# Patient Record
Sex: Male | Born: 1965 | Race: Black or African American | Hispanic: No | Marital: Married | Smoking: Never smoker
Health system: Southern US, Community
[De-identification: ages and names within clinical notes are randomized; demographics above are authoritative.]

## PROBLEM LIST (undated history)

## (undated) ENCOUNTER — Emergency Department (HOSPITAL_BASED_OUTPATIENT_CLINIC_OR_DEPARTMENT_OTHER): Admission: EM | Payer: Medicaid Other | Source: Home / Self Care

## (undated) DIAGNOSIS — Q602 Renal agenesis, unspecified: Secondary | ICD-10-CM

## (undated) DIAGNOSIS — K5731 Diverticulosis of large intestine without perforation or abscess with bleeding: Secondary | ICD-10-CM

## (undated) HISTORY — PX: OTHER SURGICAL HISTORY: SHX169

## (undated) HISTORY — PX: EYE SURGERY: SHX253

## (undated) HISTORY — PX: HAND SURGERY: SHX662

## (undated) HISTORY — PX: URETHRA SURGERY: SHX824

## (undated) HISTORY — PX: BACK SURGERY: SHX140

---

## 1997-06-21 ENCOUNTER — Emergency Department (HOSPITAL_COMMUNITY): Admission: EM | Admit: 1997-06-21 | Discharge: 1997-06-21 | Payer: Self-pay | Admitting: Emergency Medicine

## 1997-06-22 ENCOUNTER — Emergency Department (HOSPITAL_COMMUNITY): Admission: EM | Admit: 1997-06-22 | Discharge: 1997-06-22 | Payer: Self-pay | Admitting: Emergency Medicine

## 1997-06-23 ENCOUNTER — Inpatient Hospital Stay (HOSPITAL_COMMUNITY): Admission: AD | Admit: 1997-06-23 | Discharge: 1997-06-28 | Payer: Self-pay | Admitting: Urology

## 1997-08-23 ENCOUNTER — Ambulatory Visit (HOSPITAL_COMMUNITY): Admission: RE | Admit: 1997-08-23 | Discharge: 1997-08-23 | Payer: Self-pay | Admitting: Family Medicine

## 2002-10-30 ENCOUNTER — Emergency Department (HOSPITAL_COMMUNITY): Admission: EM | Admit: 2002-10-30 | Discharge: 2002-10-30 | Payer: Self-pay | Admitting: *Deleted

## 2005-09-29 ENCOUNTER — Ambulatory Visit (HOSPITAL_BASED_OUTPATIENT_CLINIC_OR_DEPARTMENT_OTHER): Admission: RE | Admit: 2005-09-29 | Discharge: 2005-09-29 | Payer: Self-pay | Admitting: Urology

## 2005-11-20 ENCOUNTER — Emergency Department (HOSPITAL_COMMUNITY): Admission: EM | Admit: 2005-11-20 | Discharge: 2005-11-20 | Payer: Self-pay | Admitting: *Deleted

## 2006-01-25 ENCOUNTER — Encounter: Admission: RE | Admit: 2006-01-25 | Discharge: 2006-01-25 | Payer: Self-pay | Admitting: Orthopedic Surgery

## 2006-02-10 ENCOUNTER — Encounter: Admission: RE | Admit: 2006-02-10 | Discharge: 2006-02-10 | Payer: Self-pay | Admitting: Orthopedic Surgery

## 2006-03-04 ENCOUNTER — Encounter: Admission: RE | Admit: 2006-03-04 | Discharge: 2006-03-04 | Payer: Self-pay | Admitting: Orthopedic Surgery

## 2007-01-21 ENCOUNTER — Encounter: Admission: RE | Admit: 2007-01-21 | Discharge: 2007-01-21 | Payer: Self-pay | Admitting: Family Medicine

## 2007-04-16 ENCOUNTER — Emergency Department (HOSPITAL_COMMUNITY): Admission: EM | Admit: 2007-04-16 | Discharge: 2007-04-16 | Payer: Self-pay | Admitting: Emergency Medicine

## 2007-05-06 ENCOUNTER — Encounter: Admission: RE | Admit: 2007-05-06 | Discharge: 2007-05-06 | Payer: Self-pay | Admitting: Orthopedic Surgery

## 2007-06-13 ENCOUNTER — Encounter: Admission: RE | Admit: 2007-06-13 | Discharge: 2007-06-13 | Payer: Self-pay | Admitting: Neurological Surgery

## 2007-07-15 ENCOUNTER — Ambulatory Visit (HOSPITAL_BASED_OUTPATIENT_CLINIC_OR_DEPARTMENT_OTHER): Admission: RE | Admit: 2007-07-15 | Discharge: 2007-07-15 | Payer: Self-pay | Admitting: Urology

## 2007-08-01 ENCOUNTER — Ambulatory Visit: Payer: Self-pay | Admitting: *Deleted

## 2007-08-01 ENCOUNTER — Inpatient Hospital Stay (HOSPITAL_COMMUNITY): Admission: RE | Admit: 2007-08-01 | Discharge: 2007-08-05 | Payer: Self-pay | Admitting: Neurological Surgery

## 2008-09-25 ENCOUNTER — Emergency Department (HOSPITAL_COMMUNITY): Admission: EM | Admit: 2008-09-25 | Discharge: 2008-09-25 | Payer: Self-pay | Admitting: Emergency Medicine

## 2008-10-01 ENCOUNTER — Ambulatory Visit (HOSPITAL_COMMUNITY): Admission: RE | Admit: 2008-10-01 | Discharge: 2008-10-01 | Payer: Self-pay | Admitting: Urology

## 2009-01-26 IMAGING — CT CT L SPINE W/ CM
4 of 7 series · 15 of 33 positions shown, 17 images · non-contrast
Comparison: none

CLINICAL DATA: Pain low lumbosacral region, right greater than left.  He was referred for diagnostic disk injections. 

DIAGNOSTIC LUMBAR DISK INJECTIONS:
The patient has a transitional anatomy.  Rudimentary disk at the L5-S1 level is noted.
TECHNIQUE: He received 1 gram IV Ancef prior to the procedure.  3 cc of Ancef were placed in the contrast vial utilized for the procedure.  A left posterolateral approach was utilized.  22 gauge Ferdyando Mainan were directed into the L2-3, L3-4, L4-5 disks.  Contrast was injected utilizing pressure manometry.  
L2-3:  Opening pressure 20 PSI.  We obtained a pressure of 180 with a firm end point elicited.  Contrast collects centrally within the nucleus.  The injection provokes discomfort.  He is uncertain whether this is similar to that which he has on a regular basis.  
L3-4:  Opening pressure 30.  We obtained a pressure of 200 with a firm end point noted.  There was no provocative response.  
L4-5:  Opening pressure 15.  A pressure of 80 was obtained with a firm end point noted.  There was an annular tear with epidural extension.  This provokes discomfort.  He is uncertain whether this is his pain or not.
TECHNIQUE: Multidetector CT imaging of the lumbar spine was performed after intradiskal injection of contrast.  Multiplanar CT image reconstructions were also generated.

[Series 2: l-spine helical · axial · 0.27mm/px · z∈[-98,-6]mm · 4 of 59 slices shown, 5 images]
[im 12/59  soft-tissue]
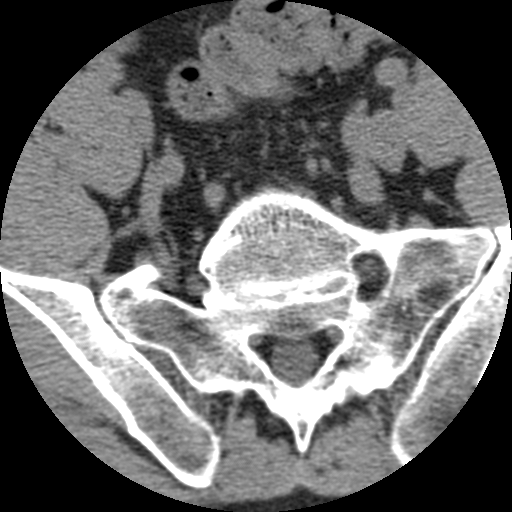
[im 12/59  bone]
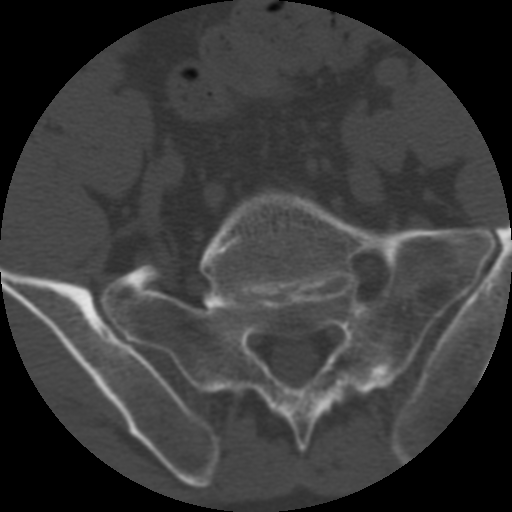
[im 24/59  bone]
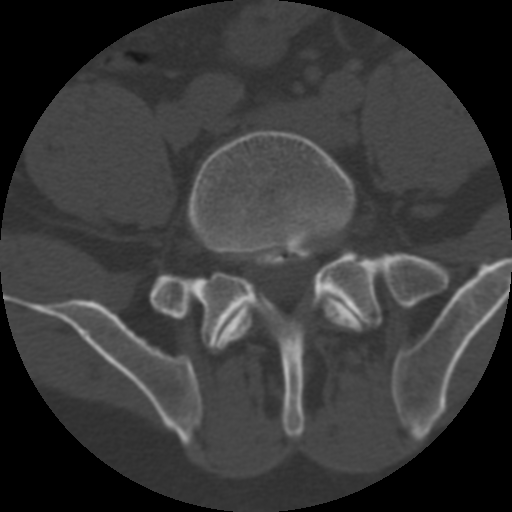
[im 35/59  bone]
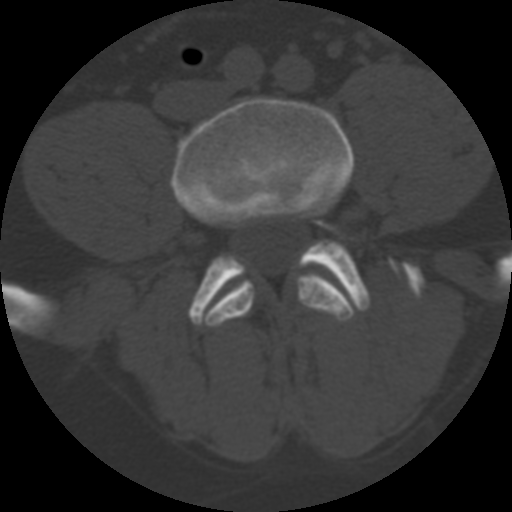
[im 47/59  bone]
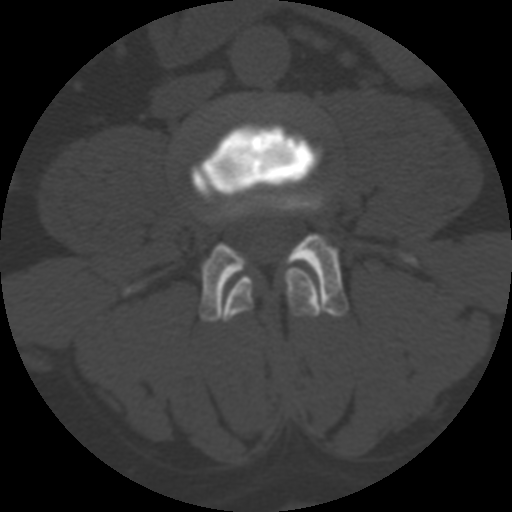

[Series 3: bone windows · axial · 0.27mm/px · z∈[-88,-14]mm · 3 of 60 slices shown]
[im 15/60  bone]
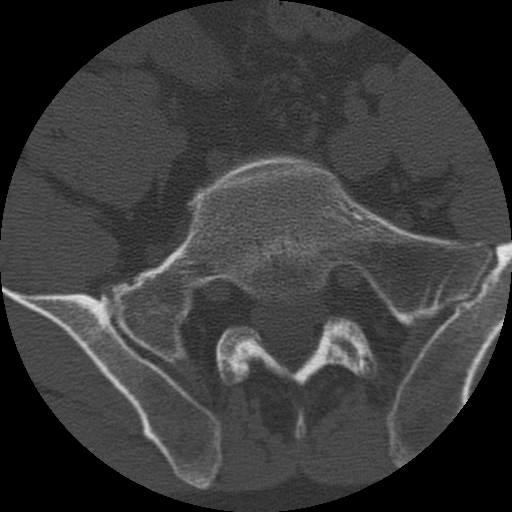
[im 30/60  bone]
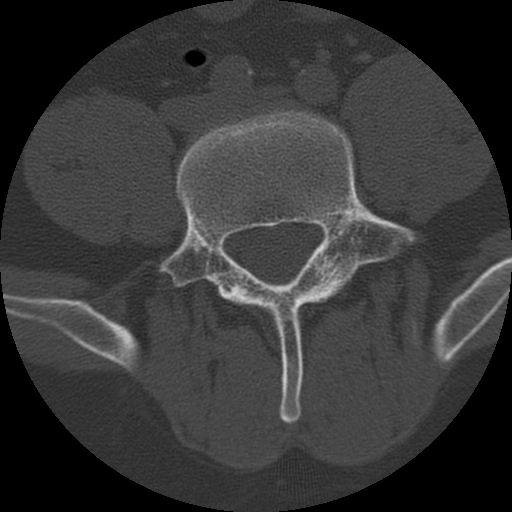
[im 45/60  bone]
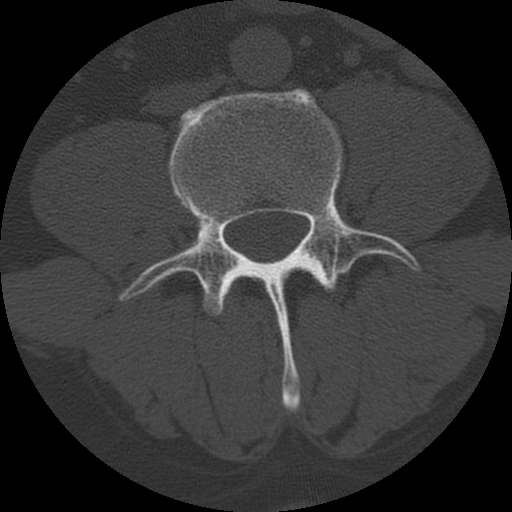

[Series 400: sagittal · sagittal · 0.30mm/px · 5 of 40 slices shown, 6 images]
[im 14/40  bone]
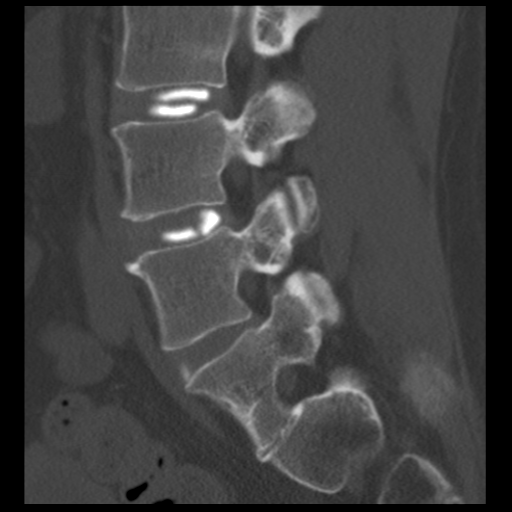
[im 17/40  bone]
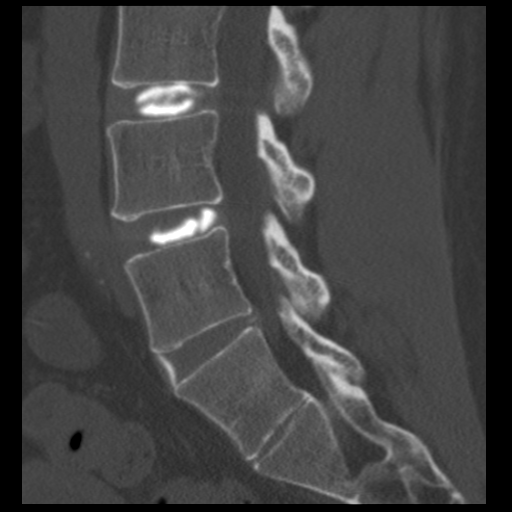
[im 20/40  soft-tissue]
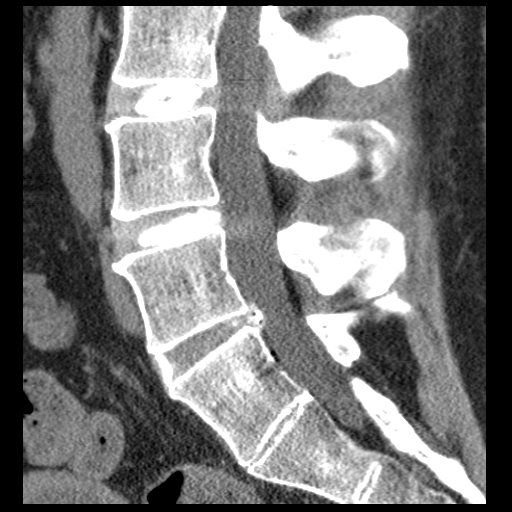
[im 20/40  bone]
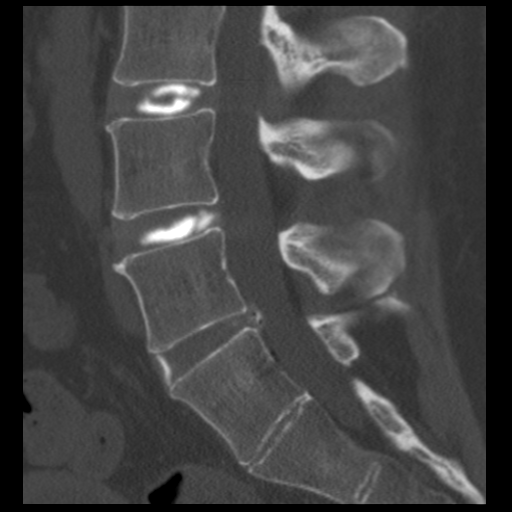
[im 23/40  bone]
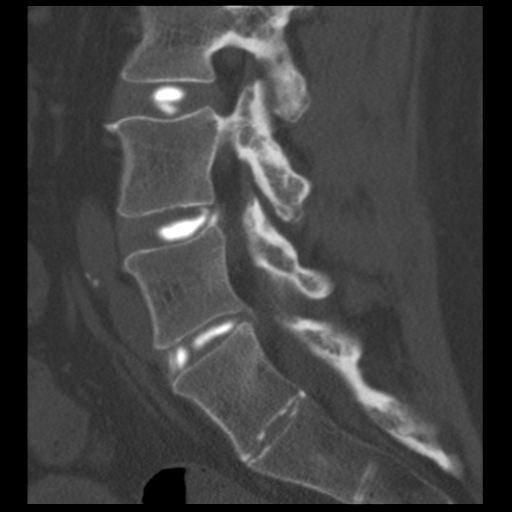
[im 27/40  bone]
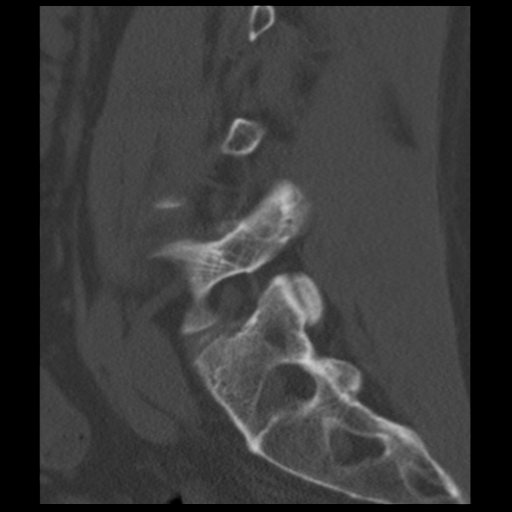

[Series 401: coronal · coronal · 0.30mm/px · 3 of 37 slices shown]
[im 8/37  bone]
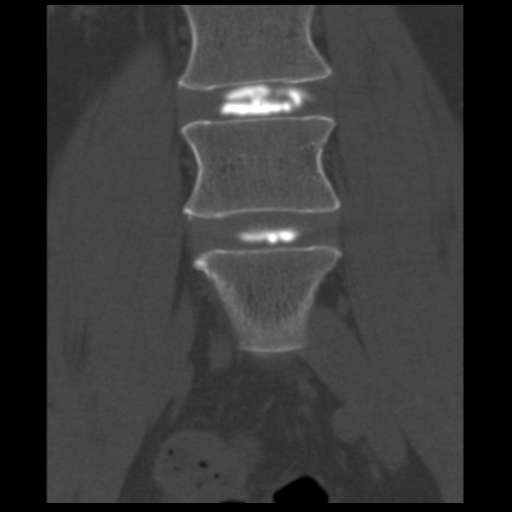
[im 15/37  bone]
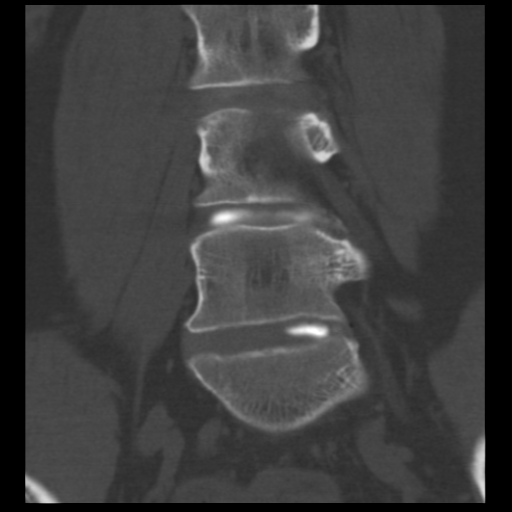
[im 22/37  bone]
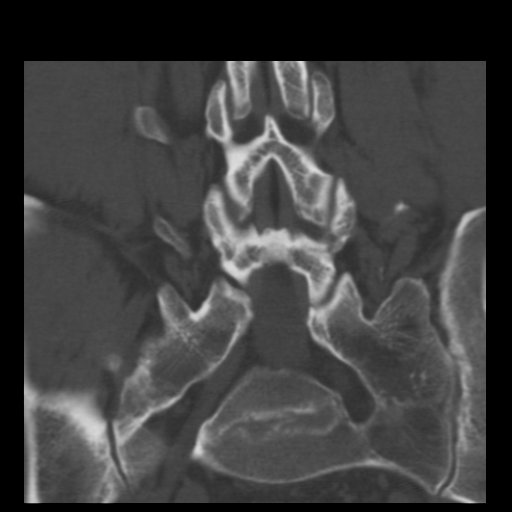

[15 of 33 positions shown; findings below may reference images not displayed]

IMPRESSION: 1.  Provocative diskography L2-3, L3-4, L4-5.  Technically successful.  We do not clearly identify a concordant pain response during the injection of his disk.  
2.  Indeterminate pain responses at L2-3,L4-5  No response elicited during the injection of L3-4.  
POST DISKOGRAM CT SCAN OF THE LUMBAR SPINE:
FINDINGS: Rudimentary disk level at L5-S1.  The last open level is denoted L4-5.  
Intradiskal contrast was injected into the L2-3, L3-4, and L4-5 disks.  There is no disk space at L5-S1 which would allow injection. 
Sagittal reformatted images show anatomic alignment without listhesis.  Disk protrusions are seen L3-4, L4-5.
Axial imaging:  
L2-3:  Contrast collects centrally within the nucleus.  No central nor foraminal stenosis.
L3-4:  Disk bulge eccentric left.  Contrast is seen centrally within the nucleus extending into the outer annular fibers.  No focal protrusion, central nor foraminal stenosis.  Mild facet degenerative change.
L4-5:  Shallow broad central protrusion with broad annular tear centrally.  L4 roots exit without encroachment.  There is no central nor lateral recess stenosis.  Mild facet degenerative change.
At the L5-S1 level, there mild facet degenerative changes noted.  No central nor foraminal stenosis.
IMPRESSION: Rudimentary disk at L5-S1.  Disk protrusion L3-4, L4-5.  No central nor foraminal stenosis.

## 2009-06-12 ENCOUNTER — Encounter: Admission: RE | Admit: 2009-06-12 | Discharge: 2009-06-12 | Payer: Self-pay | Admitting: Neurological Surgery

## 2010-06-29 LAB — POCT I-STAT, CHEM 8
BUN: 13 mg/dL (ref 6–23)
Chloride: 105 mEq/L (ref 96–112)
Creatinine, Ser: 1.2 mg/dL (ref 0.4–1.5)
Glucose, Bld: 82 mg/dL (ref 70–99)
Hemoglobin: 15.3 g/dL (ref 13.0–17.0)
Potassium: 4.1 mEq/L (ref 3.5–5.1)
Sodium: 138 mEq/L (ref 135–145)

## 2010-06-29 LAB — URINE CULTURE
Colony Count: >=100000
Special Requests: NEGATIVE

## 2010-06-29 LAB — HEMOGLOBIN AND HEMATOCRIT, BLOOD: HCT: 45.8 % (ref 39.0–52.0)

## 2010-08-05 NOTE — Discharge Summary (Signed)
NAME:  Darren Sullivan, Darren Sullivan              ACCOUNT NO.:  [id number]   MEDICAL RECORD NO.:  [id number]          PATIENT TYPE:  INP   LOCATION:  3006                         FACILITY:  [facility]   PHYSICIAN:  Stefani Dama, M.D.  DATE OF BIRTH:  04/10/1965   DATE OF ADMISSION:  08/01/2007  DATE OF DISCHARGE:  08/05/2007                               DISCHARGE SUMMARY   ADMITTING DIAGNOSIS:  Lumbar spondylosis, L4-L5 with lumbar  radiculopathy.   DISCHARGE AND FINAL DIAGNOSIS:  Lumbar spondylosis, L4-L5 with lumbar  radiculopathy.   OPERATION:  Anterior lumbar decompression arthrodesis, L4-L5.   CONDITION ON DISCHARGE:  Improving.   HOSPITAL COURSE:  [name] is a 45 year old individual who has had  significant problems with severe back pain, unresponsive to efforts at  conservative management.  Diskography was performed, however, by report  was inconclusive.  However, on evaluation of the films, it was noted  that he had marked degenerative changes at the L4-L5 disk space, which  appear to be a transitional level.  The patient was advised regarding  anterior decompression arthrodesis and was brought to the hospital where  this procedure was undertaken.  He was mobilized on the second and third  postoperative day; however, he had no bowel movements.  He did have some  postoperative ileus, seemed to resolve with Dulcolax suppository and  enemas.  At the current time, he is discharged home with Percocet #60  without refills, Valium 5 mg #40 without refills, and Flexeril 10 mg one  q.8 h.  He will be seen in the office in 3 weeks' time.   CONDITION ON DISCHARGE:  Improving.      Stefani Dama, M.D.  Electronically Signed     HJE/MEDQ  D:  08/05/2007  T:  08/05/2007  Job:  [id number]

## 2010-08-05 NOTE — Op Note (Signed)
NAME:  Darren Sullivan, Darren Sullivan              ACCOUNT NO.:  [id number]   MEDICAL RECORD NO.:  [id number]          PATIENT TYPE:  AMB   LOCATION:  [facility]                         FACILITY:  [facility]   PHYSICIAN:  Darren Sinner, MD DATE OF BIRTH:  07-10-1965   DATE OF PROCEDURE:  07/15/2007  DATE OF DISCHARGE:                               OPERATIVE REPORT   PREOPERATIVE DIAGNOSIS:  Urethral stricture.   POSTOPERATIVE DIAGNOSIS:  Urethral stricture.   SURGERY:  Cystoscopy, retrograde urethrogram, balloon dilation urethral  stricture.   Darren Sullivan has recurrent urethral stricture disease in spite of a  urethroplasty done a few years ago.  [name], but stable.  He  is going to have back surgery I believe on Aug 10, 2007.   [name].  He was on the  [facility] table in the lithotomy position.  He was given preoperative  antibiotics.  I did two retrograde urethrograms each time instilling  approximately 8 mL of contrast through the urethra.   Retrograde urethrogram:  I injected approximately 7 to 8 mL twice of  contrast under fluoroscopic guidance.  I had the penis on traction  parallel and towards the left side.  One could see a proximal bulbar  stricture with an hourglass effect in the mid proximal bulbar urethra.  Dye did reach the bladder.  There was no extravasation.   I then cystoscoped the patient with the 17-French sheath.  The penile  and distal bulbar urethra was normal.  It was a little bit of reddened  from the retrograde urethrogram.  He had an approximately 10-French  stricture with a little bit of a moth-eaten appearance in the proximal  or mid bulbar urethra.  I could see the urethral lumen throughout, but I  could not scope through it.  I tried to pass a sensor wire, but it was  catching proximally within the urethral stricture.   I then upgraded the size of the scope to 22-French.  I used a floppy tip  wire with a movable core and  it went in beautifully up through the  urethral stricture into the bladder.  This was checked fluoroscopically.  I then placed the 24-French Cook balloon dilation catheter with its  marker to approximately the bladder neck.  I balloon dilated for 5  minutes with 18 atmospheres of pressure.  I deflated the balloon,  removed the balloon and recystoscoped with the 17-French scope over the  wire.  I easily entered into the bladder and removed the wire.  He had  grade 2/4 bladder trabeculation which is out of the ordinary for his  age.  This has been noted before.  The prostatic urethra was normal.  The verumontanum and prostatic urethra were normal.  The proximal 1.5 cm  of the bulbar urethra were normal.  He then had an open urethral  stricture of approximately 22-French.  There was a little bit moth-eaten  from the dilation.  It was a bit irregular.  In my opinion he did not  have a really healthy roof strip especially in  one location.  He did not  have any obvious injury to the urethra.  Bleeding was minimal to none.  This dilated stricture was now approximately 22-French and approximately  5 cm in length.   I placed an 18-French coude catheter into the bladder easily.  I did  this since I thought that he was going to be quite sore afterwards.  We  will give him instructions to remove the catheter the next day.  He will  be given ciprofloxacin and Vicodin.   I will follow Darren Sullivan conservatively.  Hopefully in the future he will be  able to visit to Dr. Meredith Sullivan at [facility] to have a more  definitive repair.           ______________________________  Darren Sinner, MD  Electronically Signed     SAM/MEDQ  D:  07/15/2007  T:  07/15/2007  Job:  [id number]

## 2010-08-05 NOTE — Op Note (Signed)
NAME:  Darren Sullivan, Darren Sullivan              ACCOUNT NO.:  [id number]   MEDICAL RECORD NO.:  [id number]          PATIENT TYPE:  AMB   LOCATION:  DAY                          FACILITY:  [facility]   PHYSICIAN:  Martina Sinner, MD DATE OF BIRTH:  1965-05-13   DATE OF PROCEDURE:  10/01/2008  DATE OF DISCHARGE:                               OPERATIVE REPORT   PREOPERATIVE DIAGNOSIS:  Urethral stricture.   POSTOPERATIVE DIAGNOSIS:  Urethral stricture.   PROCEDURE PERFORMED:  1. Retrograde urethrogram with intraoperative fluoroscopy      interpretation.  2. Cystourethroscopy.  3. Balloon dilatation of urethral stricture.   SURGEON:  Scott A. MacDiarmid, MD.   Valetta FullerEdward Qualia.   ANESTHESIA:  General endotracheal anesthesia.   COMPLICATIONS:  None.   FINDINGS:  A 1 cm long bulbar urethral stricture at the proximal end of  his previous urethroplasty.  There was approximately 1.5 cm of healthy  urethra proximally to the stricture, and the distal two-thirds of the  prior urethroplasty also appeared to be healthy in appearance.   INDICATIONS FOR PROCEDURE:  Mr. Squier is a very pleasant, 45 year old,  African American male with the history of a bulbar urethral stricture.  He underwent an [place] urethroplasty several years ago.  He has  subsequently developed progressive slowing of his stream and obstructive  voiding symptoms, and an office cystoscopy today revealed a high-grade  urethral stricture.  He was also noted to be in urinary retention.  Therefore, we recommended operative intervention and the patient  consented to the above.   DESCRIPTION OF PROCEDURE IN DETAIL:  Mr. Osmun was brought back to the  operating room and identified by his wrist band, and a preoperative  timeout was called to confirm correct patient, procedure, and site.  After the successful induction of general endotracheal anesthesia, he  was positioned in the dorsal lithotomy position, and great efforts  were  made to reduce any pressure any bony prominences.  Bilateral sequential  compression devices were in place, and IV antibiotics were administered.  His perineum was prepped and draped in the usual sterile fashion.  The  surgeons were sterilely gowned and gloved.   A 17-French cystoscopic sheath was used to insert a 30-degree lens  transurethrally, and a high-grade stricture was identified in the bulbar  urethra.  The cystoscope was then removed.   A 60 mL syringe was then used perform a retrograde urethrogram.  Retrograde urethrogram revealed an hourglass deformity located in the  bulbar urethra corresponding with the patient's known history of a  urethral stricture.  Proximal to this, contrast was seen entering into  the bladder.  The urethra distal to the stricture was normal in  appearance.  The stricture was approximately 1 cm in length.   The cystoscope was then reinserted and a Glidewire was advanced into the  bladder under direct vision and fluoroscopic control.  The balloon  dilator was then advanced over the Glidewire via the Seldinger technique  and was seen to be in adequate position under fluoroscopic guidance.  Using the [place] device, the balloon was inflated and held in  place for  5 minutes under 18 mmHg pressure.  The balloon was then deflated.   The balloon was removed and the 17-French cystoscopic sheath was used to  reinsert the 30-degree lens transurethrally.  Excellent results from the  balloon dilatation were seen.  The stricture was measured approximately  1 cm in length at the proximal extent of the patient's previous  urethroplasty.  There was approximately 1.5 cm of healthy urethra  proximally to the stricture.  The distal two-thirds of the previous  urethroplasty also appeared to be healthy.  Upon entrance into the  posterior urethra, the prostate did show some lateral lobe enlargement.  The bladder was entered and endoscopy was performed.  There was  some  debris floating around in the bladder, which was irrigated free.  The  mucosa was free of any abnormal lesions, diverticula, stone, or foreign  bodies.  Bilateral ureteral orifices were noted in their usual anatomic  position and seemed to be effluxing clear yellow urine.  The cystoscope  was then removed and a 16-French Foley catheter was inserted  transurethrally into the patient's bladder with return of clear yellow  urine.  The catheter was inflated with 10 mL of sterile water.  It was  connected to a drainage bag.  It was at that point that the procedure  was terminated.   Please note that Dr. Lorin Picket MacDiarmid was present, available, and  participated in all aspects of this patient's operation.   DISPOSITION:  The patient tolerated the procedure well, was extubated,  and transported to the PACU in stable condition.     ______________________________  Omelia Blackwater, MD  Electronically Signed    KR/MEDQ  D:  10/01/2008  T:  10/01/2008  Job:  [id number]

## 2010-08-05 NOTE — Op Note (Signed)
NAME:  Darren Sullivan              ACCOUNT NO.:  [id number]   MEDICAL RECORD NO.:  [id number]          PATIENT TYPE:  INP   LOCATION:  3109                         FACILITY:  [facility]   PHYSICIAN:  Stefani Dama, M.D.  DATE OF BIRTH:  Jun 19, 1965   DATE OF PROCEDURE:  08/01/2007  DATE OF DISCHARGE:                               OPERATIVE REPORT   PREOPERATIVE DIAGNOSIS:  Lumbar spondylosis with back pain L4-L5.   POSTOPERATIVE DIAGNOSIS:  Lumbar spondylosis with back pain L4-L5.   PROCEDURES:  Anterior lumbar decompression and arthrodesis L4-L5 with  LVR spacer.   SURGEON:  Stefani Dama, MD   FIRST ASSISTANT:  Payton Doughty, MD   APPROACH:  Rolena Infante. Madilyn Fireman, MD   INDICATIONS:  Darren Sullivan is a 45 year old individual who had  significant problems with back pain and bilateral lower extremity pain.  He has had all manner of conservative management having failed this over  the past year to year and half period of time.  He was ultimately  subject to diskogram.  Diskogram demonstrated the patient had a  significantly degenerated disk at the level of L4-L5.  He has a  transitional vertebra at the L5-S1 level which is not fully formed.  After careful consideration, we discussed doing an anterior lumbar  decompression at the level that is labeled as L4-L5.   PROCEDURE:  The patient was brought to the operating room and placed on  the table in supine position.  An opening was created by Dr. Liliane Bade.  Once the space identified positively with radiographs noted to be L4-L5  consistent with the diskogram was exposed by start of the procedure by  opening the anterior longitudinal ligament with a #15 blade and using  combination of Kerrison rongeurs to remove a significant quantity of  markedly degenerated disk material from the space.  The space was  cleared both laterally and medially.  A series of spacers and sizes were  then used to expand the disk space.  As region of posterior  longitudinal  ligament was reached, there was noted to be significant quantities of  disk material in the subligamentous space out in the lateral recesses.  This caused some of the lateral recess to compromise that I suspect was  giving the patient some radicular pain.  Once the spaces were cleared  and the lateral recesses were decompressed, the endplates were curetted  smooth to allow placement of a trial spacer.  It was noted that the  medium small spacer measuring 12 mm in height, 28 x 32 mm in dimension,  and 6 degrees in lordosis was fitted.  During this time also some Infuse  was prepared with the extra small size being used.  This was mixed with  a combination of some blood from the patient's space that was mixed with  some Vitoss sponge.  Sponge was placed into the spacer.  Spacer was then  placed into the interbody space.  It was checked both fluoroscopically  in the AP and lateral projection and then two small titanium nails were  placed to secure the spacer  in the space.  Once the nails were placed  with fluoroscopic guidance in, lateral gutters were packed with the  remnants of bone and Infuse.  Hemostasis was then checked in the soft  tissues.  The retractors were withdrawn and then the anterior rectus  sheath was closed with a running #1 Vicryl stitch, 2-0 Vicryl using  subcutaneous tissues, 3-0 Vicryl subcuticularly and a final 4-0 Monocryl  was used in the final subcuticular layer.  The patient tolerated the  procedure well and was returned to recovery in stable condition.  Blood  loss estimate about 100 mL.      Stefani Dama, M.D.  Electronically Signed     HJE/MEDQ  D:  08/01/2007  T:  08/02/2007  Job:  [id number]

## 2010-08-05 NOTE — Op Note (Signed)
NAME:  Darren Sullivan, Darren Sullivan NO.:  [id number]   MEDICAL RECORD NO.:  [id number]          PATIENT TYPE:  INP   LOCATION:  3109                         FACILITY:  [facility]   PHYSICIAN:  Balinda Quails, M.D.    DATE OF BIRTH:  11/14/1965   DATE OF PROCEDURE:  08/01/2007  DATE OF DISCHARGE:                               OPERATIVE REPORT   SURGEON:  Balinda Quails, MD   CO-SURGEON:  Stefani Dama, MD.   ANESTHETIC:  General endotracheal.   PREOPERATIVE DIAGNOSIS:  L4-L5 degenerative disk disease.   POSTOPERATIVE DIAGNOSIS:  L4-L5 degenerative disk disease.   PROCEDURE:  L4-L5 anterior lumbar interbody fusion (left).   HISTORY:  Darren Sullivan is a 45 year old gentleman with a history of  chronic back pain and L4-L5 degenerative disk disease.  He is scheduled  at this time to undergo L4-L5 ALIF by Dr. Danielle Dess.  The patient was seen  preoperatively in the holding area.  Normal physical examination.  No  history of DVT or pulmonary pulse.  Intact peripheral pulses.   Details of the operative procedure were reviewed with the patient and he  is scheduled and he consented for surgery.  Potential risks of the  operative procedure were reviewed with the patient including but not  limited to DVT, pulmonary embolus, vessel injury, limb ischemia,  bleeding, transfusion, infection, hernia, ureter injury, sexual  dysfunction, or other major problem.   OPERATIVE PROCEDURE:  The patient was brought to the operating room in  stable condition.  Placed supine position.  General endotracheal  anesthesia was induced.  Foley catheter arterial line and central venous  catheter in place.  Pulse oximetry was placed on the left foot and  remained stable throughout the operative procedure.   The projection of L4-L5 disk was marked on the anterior abdominal wall.  Left lower quadrant transverse skin incision was made from midline to  lateral margin of rectus muscle.  Dissection was carried  through the  subcutaneous tissue with electrocautery.  The left anterior rectus  sheath was incised from midline to lateral margin.  The superior and  inferior flaps of anterior rectus sheath were created.  The rectus  muscle was mobilized.  Retracted medially.  Left retroperitoneal space  was entered.  The peritoneal contents were rotated anteriorly.  The left  common and external iliac artery were identified.  The left ureter and  sympathetic fibers were then pushed off the common iliac artery with the  abdominal contents and rotated medially.  The L4-L5 disk was palpated.  The bifurcation was actually high that this could be palpated in the  crotch between the right and left iliac vessels.  The soft tissues were  pushed off the L4-L5 disk.  The left common iliac vein was retracted  laterally and the middle sacral vessels were ligated with clips and  divided.  The presacral fascia was incised and the soft tissues were  pushed off the L4-L5 disk, which was exposed from left to right.  The  right common iliac vein was mobilized and retracted laterally.   Using a Thompson-Brau retractor  system, the reverse lip blades were then  hooked on the lateral margins of L4-L5 bilaterally retracting the iliac  vessels and fully exposing the disk.  Malleable retractors were placed  superiorly and inferiorly.   The L4-L5 ALIF was then completed by Dr. Danielle Dess.  Dr. Danielle Dess also  completed closure, dictated under separate heading.   There were no apparent complications.   In the PACU, the patient was examined and had intact dorsalis pedis and  posterior tibial pulses bilaterally.      Balinda Quails, M.D.  Electronically Signed     PGH/MEDQ  D:  08/01/2007  T:  08/02/2007  Job:  [id number]   cc:   Stefani Dama, M.D.

## 2010-08-08 NOTE — Op Note (Signed)
NAME:  Darren Sullivan, Darren Sullivan              ACCOUNT NO.:  [id number]   MEDICAL RECORD NO.:  [id number]          PATIENT TYPE:  AMB   LOCATION:  [facility]                         FACILITY:  [facility]   PHYSICIAN:  Martina Sinner, MD DATE OF BIRTH:  October 07, 1965   DATE OF PROCEDURE:  09/29/2005  DATE OF DISCHARGE:                                 OPERATIVE REPORT   SURGEON:  Scott A. MacDiarmid, M.D.   PREOPERATIVE DIAGNOSIS:  Urethral stricture.   POSTOPERATIVE DIAGNOSIS:  Urethral stricture.   SURGERY:  Cystoscopy, retrograde urethrogram, and balloon dilation of  urethral stricture.   [name] had a urethroplasty approximately 2 years ago.  I have  asked for the medical records from [facility], but the stricture was  approximately 5-6 cm in length and was repair with an augmentation procedure  using full-thickness penile skin.  He was doing well until he developed  obstructive symptoms 5 months ago.   [name].  I initially  cystoscoped the patient with a 21-French scope.  He had approximately a 6-7  [place] stricture recurrence near the proximal bulbar urethra.   I then performed a retrograde urethrogram.  He had an hourglass deformity,  in my opinion, approximately mid-urethroplasty, which was approximately 1 cm  in length.  A photograph was taken.   Cystoscopically, a sensor wire was easily placed into the bladder.  I then  gently passed a well-lubricated balloon dilation catheter, bridging the  stricture.  I dilated to 24 [place] for 5 minutes under 18 atmospheres of  pressure.  I then re-cystoscoped the patient.   I could pass through the stricture that was now balloon dilated with a 22  Jamaica scope.  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  The ureteral orifices were identified.  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  There was no stone.   On inspection of the urethra, the prostatic urethra, verumontanum, and  membranous urethra were normal.  The proximal 2 cm of the bulbar urethra  were normal.  Endoscopically, the penile skin augmentation looked to be  approximately 6 cm in length.  In my opinion, he had a 1 cm stricture that  was balloon dilated just proximal to the middle of the graft.  Proximal to  this, there was a little bit of ballooning at 6 o'clock, likely due to high  pressure, but there was not a diverticulum.  Distal to the dilation, though  there was some cicatrix involving the graft epithelium, it was patent and  open.  The dilated area was dilated to approximately 24 [place].   Not surprising, the 14 French straight catheter would not easily pass  because of the small bulge in the floor of the proximal repaired urethra.  A  Coude tip catheter easily was placed into the bladder.  The bladder was  emptied.  The catheter was removed.  There was no bleeding, and the  catheter  was not utilized.   I will keep Darren Sullivan on ciprofloxacin for five more days, and I gave him  a prescription of Vicodin.   I spoke to his wife, and I will speak to him about options including  watchful waiting, self-catheterization, versus repeat urethroplasty.  If he  did want to have another urethroplasty, I think buccal mucosa would be in  his best interest.  I did talk to his wife about his sexual performance.  She says there are many times that his erections are completely normal.  When he becomes more symptomatic with discomfort secondary to the stricture,  he has more trouble.  Initially, he was having some trouble after surgery,  but she thought that overall on many occasions the erections were normal.  She is not certain whether or not he needs Cialis but sometimes takes it.  She agrees with me that he may be having some performance anxiety  associated  with his mild dysfunction.   [name] will be counseled regarding treatment options next week when I  see him in followup.           ______________________________  Martina Sinner, MD  Electronically Signed     SAM/MEDQ  D:  09/29/2005  T:  09/29/2005  Job:  [id number]

## 2010-12-11 LAB — BASIC METABOLIC PANEL
BUN: 11
Calcium: 9.1
GFR calc non Af Amer: >60
Glucose, Bld: 86
Sodium: 139

## 2010-12-11 LAB — CBC
Hemoglobin: 15.1
Platelets: 291
RDW: 14.1

## 2010-12-11 LAB — DIFFERENTIAL
Basophils Absolute: 0.1
Lymphocytes Relative: 27
Neutro Abs: 9 — ABNORMAL HIGH

## 2010-12-11 LAB — URINALYSIS, ROUTINE W REFLEX MICROSCOPIC
Glucose, UA: NEGATIVE
Ketones, ur: >80 — AB
pH: 5.5

## 2010-12-11 LAB — URINE MICROSCOPIC-ADD ON

## 2010-12-16 LAB — POCT HEMOGLOBIN-HEMACUE
Hemoglobin: 14.2
Operator id: 134391

## 2012-10-21 ENCOUNTER — Emergency Department (HOSPITAL_COMMUNITY)
Admission: EM | Admit: 2012-10-21 | Discharge: 2012-10-21 | Disposition: A | Discharge status: Home / Self Care | Payer: Self-pay | Attending: Emergency Medicine | Admitting: Emergency Medicine

## 2012-10-21 ENCOUNTER — Encounter (HOSPITAL_COMMUNITY): Payer: Self-pay | Admitting: Emergency Medicine

## 2012-10-21 DIAGNOSIS — Z880 Allergy status to penicillin: Secondary | ICD-10-CM | POA: Insufficient documentation

## 2012-10-21 DIAGNOSIS — R338 Other retention of urine: Secondary | ICD-10-CM

## 2012-10-21 DIAGNOSIS — R109 Unspecified abdominal pain: Secondary | ICD-10-CM | POA: Insufficient documentation

## 2012-10-21 DIAGNOSIS — R339 Retention of urine, unspecified: Secondary | ICD-10-CM | POA: Insufficient documentation

## 2012-10-21 MED ORDER — TAMSULOSIN HCL 0.4 MG PO CAPS: 0.4000 mg | ORAL_CAPSULE | Freq: Every day | ORAL | Status: DC

## 2012-10-21 MED ORDER — LORAZEPAM 2 MG/ML IJ SOLN: 1.0000 mg | Freq: Once | INTRAMUSCULAR | Status: DC

## 2012-10-21 NOTE — Progress Notes (Signed)
Patient ID: Darren Sullivan, male   DOB: 12/01/1965, 47 y.o.   MRN: [id number] Urology Consult  CC: Referring physician: Gerhard Munch Reason for referral: Urethral stricture and AUR  History of Present Illness: [name] is a 47 year old male patient of Dr. Sherron Monday seen in the emergency room do to urinary retention. He has a history of having some form of urethral surgery performed and was seen and evaluated by Dr. Sherron Monday. He indicates that he was told he was going to need reconstructive surgery with buccal mucosa by Dr. Sherron Monday when he was at [facility]. He has subsequently developed worsening of his stricture and required Balloon dilatation of that stricture. He said ever since that occurred he has been able to urinate but has slowly noticed slowing of his urinary stream. He has been able to urinate but over the past 24 hours he has noted significant slowing of his stream and then was unable to void earlier today. He came to the emergency room with significant suprapubic pain and inability to urinate. I was contacted after attempts at placing a Foley catheter were unsuccessful. I had ordered the equipment necessary for management of his stricture in the emergency room however when I went in to see the patient he told me he had just urinated and completely emptied his bladder.  History reviewed. No pertinent past medical history. Past Surgical History  Procedure Laterality Date  . Urethra recontruction surgery    . Urethra surgery    . Back surgery      Medications: Prior to Admission:  (Not in a hospital admission)  Allergies:  Allergies  Allergen Reactions  . Penicillins Hives    No family history on file.  Social History:  reports that he has never smoked. He does not have any smokeless tobacco history on file. He reports that he does not drink alcohol or use illicit drugs.  Review of Systems: Pertinent items are noted in HPI. A comprehensive review of systems was  negative except as noted above  Physical Exam:  Vital signs in last 24 hours: Temp:  [98 F (36.7 C)-98.2 F (36.8 C)] 98.2 F (36.8 C) (08/01 1348) Pulse Rate:  [71-78] 78 (08/01 1348) Resp:  [16-28] 16 (08/01 1348) BP: (123-135)/(81-112) 123/81 mmHg (08/01 1348) SpO2:  [98 %-100 %] 98 % (08/01 1348) General appearance: alert and appears stated age Head: Normocephalic, without obvious abnormality, atraumatic Eyes: conjunctivae/corneas clear. EOM's intact.  Oropharynx: moist mucous membranes Neck: supple, symmetrical, trachea midline Resp: normal respiratory effort Cardio: regular rate and rhythm Back: symmetric, no curvature. ROM normal. No CVA tenderness. GI: soft, non-tender; bowel sounds normal; no masses,  no organomegaly  Male genitalia: penis: normal male phallus with no lesions or discharge.Testes: bilaterally descended with no masses or tenderness. no hernias  Extremities: extremities normal, atraumatic, no cyanosis or edema Skin: Skin color normal. No visible rashes or lesions Neurologic: Grossly normal  Laboratory Data:  No results found for this basename: WBC, HGB, HCT,  in the last 72 hours BMET No results found for this basename: NA, K, CL, CO2, GLUCOSE, BUN, CREATININE, CALCIUM,  in the last 72 hours No results found for this basename: LABPT, INR,  in the last 72 hours No results found for this basename: LABURIN,  in the last 72 hours Results for orders placed during the hospital encounter of 10/01/08  URINE CULTURE     Status: None   Collection Time    10/01/08  2:14 PM      Result  Value Range Status   Specimen Description URINE, CATHETERIZED   Final   Special Requests Vancomycin and Gentomicin IMMUNE:NORM UT SYMPT:NEG   Final   Colony Count >=100,000 COLONIES/ML   Final   Culture     Final   Value: ENTEROCOCCUS SPECIES     STAPHYLOCOCCUS SPECIES (COAGULASE NEGATIVE)     Note: RIFAMPIN AND GENTAMICIN SHOULD NOT BE USED AS SINGLE DRUGS FOR TREATMENT OF  STAPH INFECTIONS.   Report Status 10/04/2008 FINAL   Final   Organism ID, Bacteria ENTEROCOCCUS SPECIES   Final   Organism ID, Bacteria STAPHYLOCOCCUS SPECIES (COAGULASE NEGATIVE)   Final   Creatinine: No results found for this basename: CREATININE,  in the last 168 hours  Imaging: No results found.  Impression/Assessment:  He has a history of significant urethral stricture disease who underwent an onlay urethroplasty in the past and then required 2 subsequent balloon dilatations of his urethra by Dr. Sherron Monday. He reports that he was told he would need further surgery likely with buccal mucosa but has not undergone this. He is interested in pursuing this. I don't believe Dr. Sherron Monday performs that type of surgery any more so I am going to get him an appointment at [facility] for further evaluation and treatment.  Plan:  He will be scheduled for an appointment at [facility].  Garnett Farm 10/21/2012, 2:13 PM

## 2012-10-21 NOTE — ED Notes (Addendum)
Pt reports being unable to urinate since yesterday evening.  Hx of urethral reconstructive surgery in 2009.

## 2012-10-21 NOTE — ED Provider Notes (Signed)
  CSN: [id number]     Arrival date & time 10/21/12  1154 History     First MD Initiated Contact with Patient 10/21/12 1157     Chief Complaint  Patient presents with  . Urinary Retention    HPI  Patient presents with abdominal pain, extreme discomfort. Symptoms began acutely over the past 18 hours, though there has been mild abdominal pain was decreased urine for the past several days.  Over the past day hours the patient has had no urine output.  The pain is severe, lower abdominal, nonradiating. No relief with anything. Patient has a notable history of urethral reconstruction in the distant past.   History reviewed. No pertinent past medical history. Past Surgical History  Procedure Laterality Date  . Urethra recontruction surgery    . Urethra surgery    . Back surgery     No family history on file. History  Substance Use Topics  . Smoking status: Never Smoker   . Smokeless tobacco: Not on file  . Alcohol Use: No    Review of Systems  All other systems reviewed and are negative.    Allergies  Penicillins  Home Medications   Current Outpatient Rx  Name  Route  Sig  Dispense  Refill  . amoxicillin (AMOXIL) 500 MG capsule   Oral   Take 1,000 mg by mouth once.          BP 135/112  Pulse 71  Temp(Src) 98 F (36.7 C)  Resp 28  SpO2 100% Physical Exam  Nursing note and vitals reviewed. Constitutional: He is oriented to person, place, and time. He appears well-developed and well-nourished. He appears distressed.  Very uncomfortable appearing male, standing upright, jumping around  HENT:  Head: Normocephalic and atraumatic.  Eyes: Conjunctivae and EOM are normal.  Pulmonary/Chest: No stridor. No respiratory distress.  Abdominal: He exhibits distension. There is tenderness.  Musculoskeletal:  No gross abnormalities  Neurological: He is alert and oriented to person, place, and time.  Skin: Skin is warm. He is diaphoretic.  Psychiatric: He has a normal mood  and affect.    ED Course   Procedures (including critical care time)  Labs Reviewed  URINALYSIS, ROUTINE W REFLEX MICROSCOPIC   No results found. No diagnosis found. Immediately after the patient's initial evaluation latter scan was performed.  This demonstrated significant urine in the bladder.  Multiple ED attempts to obtain Foley catheter placement were unsuccessful. However, after consult to urology, while the patient was awaiting the arrival of the urologist he had spontaneous production of copious amounts of urine. MDM  Patient presents with abdominal pain, urinary retention.  Patient is afebrile, and otherwise healthy.  Patient has spontaneous resolution of his condition here.  The urologist saw the patient as well.  She was appropriate for discharge given the fact that he is now producing urine.  He has close followup arranged.  Gerhard Munch, MD 10/21/12 1315

## 2012-10-21 NOTE — ED Notes (Signed)
Urologist and urology cart at bedside.

## 2012-10-21 NOTE — Progress Notes (Signed)
[facility] did not get to see patient but will be sending information about the [facility] Orange Card program, using the address provided.  °

## 2012-10-21 NOTE — ED Notes (Signed)
Attempted 16 french foley and was not successful. MD Jeraldine Loots attempted 12 french foley and was not successful.

## 2012-11-14 ENCOUNTER — Encounter (HOSPITAL_COMMUNITY): Payer: Self-pay | Admitting: Emergency Medicine

## 2012-11-14 ENCOUNTER — Emergency Department (HOSPITAL_COMMUNITY)
Admission: EM | Admit: 2012-11-14 | Discharge: 2012-11-14 | Disposition: A | Discharge status: Home / Self Care | Payer: Self-pay | Attending: Emergency Medicine | Admitting: Emergency Medicine

## 2012-11-14 DIAGNOSIS — R109 Unspecified abdominal pain: Secondary | ICD-10-CM | POA: Insufficient documentation

## 2012-11-14 DIAGNOSIS — Z792 Long term (current) use of antibiotics: Secondary | ICD-10-CM | POA: Insufficient documentation

## 2012-11-14 DIAGNOSIS — Z880 Allergy status to penicillin: Secondary | ICD-10-CM | POA: Insufficient documentation

## 2012-11-14 DIAGNOSIS — Z79899 Other long term (current) drug therapy: Secondary | ICD-10-CM | POA: Insufficient documentation

## 2012-11-14 DIAGNOSIS — R339 Retention of urine, unspecified: Secondary | ICD-10-CM | POA: Insufficient documentation

## 2012-11-14 MED ORDER — DIAZEPAM 5 MG PO TABS
5.0000 mg | ORAL_TABLET | Freq: Once | ORAL | Status: DC
  Filled 2012-11-14: qty 1

## 2012-11-14 MED ORDER — TAMSULOSIN HCL 0.4 MG PO CAPS: 0.4000 mg | ORAL_CAPSULE | Freq: Every day | ORAL | Status: DC

## 2012-11-14 NOTE — ED Provider Notes (Signed)
  This was a shared visit with a mid-level provided (NP or PA).  Throughout the patient's course I was available for consultation/collaboration.   On my exam the patient was in no distress. I have previously evaluated the patient, treated the patient.  Today he was significantly better in appearance, though he appears uncomfortable.  Patient had production of urine prior to our attempts to catheterize him here.  He was discharged in stable condition after we provided prescription for generic Flomax, resources to obtain cheaper medication.     Gerhard Munch, MD 11/14/12 [phone]

## 2012-11-14 NOTE — ED Notes (Signed)
Pt arrived to the ED with a complaint of dysuria.  Pt has a history of urethral reconstruction.  Pt's urologist wants him to go to [facility] for surgery.  Pt is unable to go to [facility] at this time.  Pt was in the ED a month ago and a cathirter was attempted but was unsuccessful in placement but the procedure did open up the urethra and allowed pt to urinate.

## 2012-11-14 NOTE — ED Notes (Signed)
Pt left before completing the discharge process
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
## 2013-03-23 DIAGNOSIS — K5731 Diverticulosis of large intestine without perforation or abscess with bleeding: Secondary | ICD-10-CM

## 2013-03-23 HISTORY — DX: Diverticulosis of large intestine without perforation or abscess with bleeding: K57.31

## 2014-01-03 ENCOUNTER — Inpatient Hospital Stay (HOSPITAL_COMMUNITY)
Admission: EM | Admit: 2014-01-03 | Discharge: 2014-01-06 | DRG: 378 | Disposition: A | Discharge status: Home / Self Care | Payer: Medicaid Other | Attending: Internal Medicine | Admitting: Internal Medicine

## 2014-01-03 ENCOUNTER — Encounter (HOSPITAL_COMMUNITY): Payer: Self-pay | Admitting: Emergency Medicine

## 2014-01-03 DIAGNOSIS — K648 Other hemorrhoids: Secondary | ICD-10-CM | POA: Diagnosis present

## 2014-01-03 DIAGNOSIS — R0781 Pleurodynia: Secondary | ICD-10-CM | POA: Diagnosis not present

## 2014-01-03 DIAGNOSIS — D62 Acute posthemorrhagic anemia: Secondary | ICD-10-CM | POA: Diagnosis present

## 2014-01-03 DIAGNOSIS — K449 Diaphragmatic hernia without obstruction or gangrene: Secondary | ICD-10-CM | POA: Diagnosis present

## 2014-01-03 DIAGNOSIS — Z880 Allergy status to penicillin: Secondary | ICD-10-CM

## 2014-01-03 DIAGNOSIS — Z79899 Other long term (current) drug therapy: Secondary | ICD-10-CM

## 2014-01-03 DIAGNOSIS — K625 Hemorrhage of anus and rectum: Secondary | ICD-10-CM

## 2014-01-03 DIAGNOSIS — Z981 Arthrodesis status: Secondary | ICD-10-CM

## 2014-01-03 DIAGNOSIS — K5791 Diverticulosis of intestine, part unspecified, without perforation or abscess with bleeding: Secondary | ICD-10-CM

## 2014-01-03 DIAGNOSIS — I959 Hypotension, unspecified: Secondary | ICD-10-CM | POA: Diagnosis present

## 2014-01-03 DIAGNOSIS — K5731 Diverticulosis of large intestine without perforation or abscess with bleeding: Principal | ICD-10-CM | POA: Diagnosis present

## 2014-01-03 DIAGNOSIS — G8929 Other chronic pain: Secondary | ICD-10-CM

## 2014-01-03 DIAGNOSIS — M549 Dorsalgia, unspecified: Secondary | ICD-10-CM | POA: Diagnosis present

## 2014-01-03 DIAGNOSIS — K922 Gastrointestinal hemorrhage, unspecified: Secondary | ICD-10-CM | POA: Diagnosis present

## 2014-01-03 DIAGNOSIS — T39395A Adverse effect of other nonsteroidal anti-inflammatory drugs [NSAID], initial encounter: Secondary | ICD-10-CM

## 2014-01-03 DIAGNOSIS — N182 Chronic kidney disease, stage 2 (mild): Secondary | ICD-10-CM | POA: Diagnosis present

## 2014-01-03 LAB — CBC
HCT: 35.7 % — ABNORMAL LOW (ref 39.0–52.0)
Hemoglobin: 11.4 g/dL — ABNORMAL LOW (ref 13.0–17.0)
MCH: 25.6 pg — ABNORMAL LOW (ref 26.0–34.0)
MCHC: 31.9 g/dL (ref 30.0–36.0)
MCV: 80 fL (ref 78.0–100.0)
PLATELETS: 187 10*3/uL (ref 150–400)
RBC: 4.46 MIL/uL (ref 4.22–5.81)
RDW: 14.3 % (ref 11.5–15.5)
WBC: 11.6 10*3/uL — AB (ref 4.0–10.5)

## 2014-01-03 LAB — COMPREHENSIVE METABOLIC PANEL
ALBUMIN: 3.5 g/dL (ref 3.5–5.2)
ALT: 25 U/L (ref 0–53)
AST: 17 U/L (ref 0–37)
Alkaline Phosphatase: 46 U/L (ref 39–117)
Anion gap: 15 (ref 5–15)
BILIRUBIN TOTAL: 0.9 mg/dL (ref 0.3–1.2)
BUN: 20 mg/dL (ref 6–23)
CO2: 21 mEq/L (ref 19–32)
CREATININE: 1.2 mg/dL (ref 0.50–1.35)
Calcium: 8.7 mg/dL (ref 8.4–10.5)
Chloride: 107 mEq/L (ref 96–112)
GFR calc Af Amer: 81 mL/min — ABNORMAL LOW (ref >90)
GFR calc non Af Amer: 70 mL/min — ABNORMAL LOW (ref >90)
Glucose, Bld: 136 mg/dL — ABNORMAL HIGH (ref 70–99)
POTASSIUM: 4.3 meq/L (ref 3.7–5.3)
Sodium: 143 mEq/L (ref 137–147)
Total Protein: 6.5 g/dL (ref 6.0–8.3)

## 2014-01-03 LAB — POC OCCULT BLOOD, ED: FECAL OCCULT BLD: POSITIVE — AB

## 2014-01-03 NOTE — ED Notes (Signed)
Two liter of NS started per [place] from [place]

## 2014-01-03 NOTE — ED Notes (Signed)
Pt from home with 3-5 episodes of rectal bleeding that started at 730.  Pt reports dark red stools.  Pt was clammy and pale with EMS on arrival.  BP 100 palp with them.  ? LOC.  CAO x 4 on arrival.

## 2014-01-03 NOTE — ED Provider Notes (Signed)
CSN: [id number]     Arrival date & time 01/03/14  2151 History   First MD Initiated Contact with Patient 01/03/14 2215     Chief Complaint  Patient presents with  . GI Bleeding   (Consider location/radiation/quality/duration/timing/severity/associated sxs/prior Treatment) HPI Darren Sullivan is a 48 yo male presenting with report of restal bleeding appr 3 hours PTA.  He states about 7 pm this evening he felt abd cramping like he needed to have a bowel movement.  He had 3 loose bowel movements about 5 min apart.  The third stool he looked in the toilet and noticed it was dark brown and the water was red.  He also noticed blood on the toilet paper.  He called 911 and while waiting reports feeling light-headed and feels like he passed out.  He does report taking Aleve once or twice a day, daily for his back pain.  He reports vomiting once while having the bowel movements but it appeared to be what he ate and did not appear bloody.  He denies any abd pain.  History reviewed. No pertinent past medical history. Past Surgical History  Procedure Laterality Date  . Urethra recontruction surgery    . Urethra surgery    . Back surgery    . Eye surgery    . Hand surgery     History reviewed. No pertinent family history. History  Substance Use Topics  . Smoking status: Never Smoker   . Smokeless tobacco: Not on file  . Alcohol Use: No    Review of Systems  Constitutional: Negative for fever and chills.  HENT: Negative for sore throat.   Eyes: Negative for visual disturbance.  Respiratory: Negative for cough and shortness of breath.   Cardiovascular: Negative for chest pain and leg swelling.  Gastrointestinal: Positive for blood in stool. Negative for nausea, vomiting and diarrhea.  Genitourinary: Negative for dysuria.  Musculoskeletal: Negative for myalgias.  Skin: Negative for rash.  Neurological: Positive for syncope and light-headedness. Negative for weakness, numbness and headaches.     Allergies  Penicillins  Home Medications   Prior to Admission medications   Medication Sig Start Date End Date Taking? Authorizing Provider  naproxen sodium (ANAPROX) 220 MG tablet Take 220 mg by mouth 2 (two) times daily with a meal.   Yes Historical Provider, MD   BP 117/75  Pulse 84  Temp(Src) 98.6 F (37 C) (Oral)  Resp 22  SpO2 100% Physical Exam  Nursing note and vitals reviewed. Constitutional: He is oriented to person, place, and time. He appears well-developed and well-nourished. No distress.  HENT:  Head: Normocephalic and atraumatic.  Mouth/Throat: Oropharynx is clear and moist. Mucous membranes are pale. No oropharyngeal exudate.  Eyes: Conjunctivae are normal.  Neck: Neck supple. No thyromegaly present.  Cardiovascular: Normal rate, regular rhythm and intact distal pulses.   Pulmonary/Chest: Effort normal and breath sounds normal. No respiratory distress. He has no wheezes. He has no rales. He exhibits no tenderness.  Abdominal: Soft. Bowel sounds are normal. He exhibits no distension and no mass. There is no tenderness. There is no rebound and no guarding.  Genitourinary: Guaiac positive stool.  Musculoskeletal: He exhibits no tenderness.  Lymphadenopathy:    He has no cervical adenopathy.  Neurological: He is alert and oriented to person, place, and time. No cranial nerve deficit. Coordination normal.  Skin: Skin is warm and dry. No rash noted. He is not diaphoretic.  Psychiatric: He has a normal mood and affect.  ED Course  Procedures (including critical care time) CRITICAL CARE Performed by: Harle Battiestysinger, Hektor Huston  Total critical care time: 30 min  Critical care time was exclusive of separately billable procedures and treating other patients.  Critical care was necessary to treat or prevent imminent or life-threatening deterioration.  Critical care was time spent personally by me on the following activities: development of treatment plan with  patient and/or surrogate as well as nursing, discussions with consultants, evaluation of patient's response to treatment, examination of patient, obtaining history from patient or surrogate, ordering and performing treatments and interventions, ordering and review of laboratory studies, ordering and review of radiographic studies, pulse oximetry and re-evaluation of patient's condition.   Labs Review Labs Reviewed  CBC - Abnormal; Notable for the following:    WBC 11.6 (*)    Hemoglobin 11.4 (*)    HCT 35.7 (*)    MCH 25.6 (*)    All other components within normal limits  COMPREHENSIVE METABOLIC PANEL - Abnormal; Notable for the following:    Glucose, Bld 136 (*)    GFR calc non Af Amer 70 (*)    GFR calc Af Amer 81 (*)    All other components within normal limits  POC OCCULT BLOOD, ED - Abnormal; Notable for the following:    Fecal Occult Bld POSITIVE (*)    All other components within normal limits  TYPE AND SCREEN   Imaging Review No results found.   EKG Interpretation   Date/Time:  Wednesday January 03 2014 22:43:44 EDT Ventricular Rate:  89 PR Interval:  126 QRS Duration: 100 QT Interval:  358 QTC Calculation: 436 R Axis:   63 Text Interpretation:  Age not entered, assumed to be  48 years old for  purpose of ECG interpretation Sinus rhythm RSR' in V1 or V2, right VCD or  RVH No significant change since last tracing Confirmed by Ethelda ChickJACUBOWITZ  MD,  SAM [phone]([id number]) on 01/03/2014 11:51:56 PM      MDM   Final diagnoses:  Rectal bleeding   48 yo male presenting with 3-5 episodes of bloody stools tonight.  Reports daily NSAID use.   Hypotensive on my exam, pt became pale, and felt light-headed.  2 liters NS bolus given.  Blood pressure improved.  Dr. Ethelda ChickJacubowitz made aware of pt.   CBC, CMP, Type and Screen and Hemoccult.    Hgb 11.4,  Rectal exam: gross blood, Hemoccult positive  12:35 AM Consult with Dr. Sharyon MedicusHijazi hospitalist.  Admit to stepdown.  Protonix 40 mg IV BID  ordered.  12:56 AM Spoke to GI per hospitalist request, MD made aware of pt.  Pt resting without distress.     Filed Vitals:   01/03/14 2300 01/03/14 2315 01/03/14 2320 01/03/14 2325  BP: 110/61 115/59 115/59 113/59  Pulse: 89 84 79 78  Temp:      TempSrc:      Resp:  17 17 17   SpO2: 100% 100% 100% 100%   Meds given in ED:  Medications -   2 Liters NS bolus  New Prescriptions   No medications on file       Harle BattiestElizabeth Gianelle Mccaul, NP 01/04/14 1238

## 2014-01-03 NOTE — ED Provider Notes (Signed)
Patient developed rectal bleeding this afternoon accompanied by lightheadedness. Patient uses Aleve almost daily for chronic back pain He also experienced one episode of vomiting. No hematemesis Denies pain anywhere. Patient reports he suffered syncopal event immediately after vomiting this afternoon. On exam patient is alert nontoxic lungs clear auscultation heart regular rate and rhythm abdomen nondistended nontender  Doug SouSam Tria Noguera, MD 01/03/14 2253

## 2014-01-04 ENCOUNTER — Encounter (HOSPITAL_COMMUNITY)
Admission: EM | Disposition: A | Discharge status: Home / Self Care | Payer: Self-pay | Source: Home / Self Care | Attending: Internal Medicine

## 2014-01-04 ENCOUNTER — Encounter (HOSPITAL_COMMUNITY): Payer: Self-pay | Admitting: *Deleted

## 2014-01-04 DIAGNOSIS — N182 Chronic kidney disease, stage 2 (mild): Secondary | ICD-10-CM | POA: Diagnosis present

## 2014-01-04 DIAGNOSIS — K922 Gastrointestinal hemorrhage, unspecified: Secondary | ICD-10-CM | POA: Diagnosis present

## 2014-01-04 DIAGNOSIS — Z981 Arthrodesis status: Secondary | ICD-10-CM | POA: Diagnosis not present

## 2014-01-04 DIAGNOSIS — K449 Diaphragmatic hernia without obstruction or gangrene: Secondary | ICD-10-CM | POA: Diagnosis present

## 2014-01-04 DIAGNOSIS — Z880 Allergy status to penicillin: Secondary | ICD-10-CM | POA: Diagnosis not present

## 2014-01-04 DIAGNOSIS — K625 Hemorrhage of anus and rectum: Secondary | ICD-10-CM

## 2014-01-04 DIAGNOSIS — D62 Acute posthemorrhagic anemia: Secondary | ICD-10-CM | POA: Diagnosis not present

## 2014-01-04 DIAGNOSIS — R0781 Pleurodynia: Secondary | ICD-10-CM | POA: Diagnosis not present

## 2014-01-04 DIAGNOSIS — G8929 Other chronic pain: Secondary | ICD-10-CM

## 2014-01-04 DIAGNOSIS — T39395A Adverse effect of other nonsteroidal anti-inflammatory drugs [NSAID], initial encounter: Secondary | ICD-10-CM

## 2014-01-04 DIAGNOSIS — M549 Dorsalgia, unspecified: Secondary | ICD-10-CM | POA: Diagnosis present

## 2014-01-04 DIAGNOSIS — K5731 Diverticulosis of large intestine without perforation or abscess with bleeding: Secondary | ICD-10-CM | POA: Diagnosis not present

## 2014-01-04 DIAGNOSIS — Z79899 Other long term (current) drug therapy: Secondary | ICD-10-CM | POA: Diagnosis not present

## 2014-01-04 DIAGNOSIS — K648 Other hemorrhoids: Secondary | ICD-10-CM | POA: Diagnosis present

## 2014-01-04 DIAGNOSIS — I959 Hypotension, unspecified: Secondary | ICD-10-CM | POA: Diagnosis not present

## 2014-01-04 HISTORY — PX: FLEXIBLE SIGMOIDOSCOPY: SHX5431

## 2014-01-04 HISTORY — PX: ESOPHAGOGASTRODUODENOSCOPY: SHX5428

## 2014-01-04 LAB — HEMOGLOBIN
Hemoglobin: 8.8 g/dL — ABNORMAL LOW (ref 13.0–17.0)
Hemoglobin: 8.7 g/dL — ABNORMAL LOW (ref 13.0–17.0)
Hemoglobin: 8.3 g/dL — ABNORMAL LOW (ref 13.0–17.0)
Hemoglobin: 8.3 g/dL — ABNORMAL LOW (ref 13.0–17.0)

## 2014-01-04 LAB — PREPARE RBC (CROSSMATCH)

## 2014-01-04 SURGERY — EGD (ESOPHAGOGASTRODUODENOSCOPY)
Anesthesia: Moderate Sedation

## 2014-01-04 MED ORDER — SODIUM CHLORIDE 0.9 % IJ SOLN
3.0000 mL | Freq: Two times a day (BID) | INTRAMUSCULAR | Status: DC
  Administered 2014-01-05 – 2014-01-06 (×2): 3 mL via INTRAVENOUS

## 2014-01-04 MED ORDER — PANTOPRAZOLE SODIUM 40 MG IV SOLR: 40.0000 mg | Freq: Two times a day (BID) | INTRAVENOUS | Status: DC

## 2014-01-04 MED ORDER — ACETAMINOPHEN 325 MG PO TABS
650.0000 mg | ORAL_TABLET | Freq: Four times a day (QID) | ORAL | Status: DC | PRN
Start: 2014-01-04 — End: 2014-01-06

## 2014-01-04 MED ORDER — ONDANSETRON HCL 4 MG PO TABS
4.0000 mg | ORAL_TABLET | Freq: Four times a day (QID) | ORAL | Status: DC | PRN
Start: 2014-01-04 — End: 2014-01-06

## 2014-01-04 MED ORDER — FENTANYL CITRATE 0.05 MG/ML IJ SOLN
INTRAMUSCULAR | Status: AC
  Filled 2014-01-04: qty 2

## 2014-01-04 MED ORDER — BUTAMBEN-TETRACAINE-BENZOCAINE 2-2-14 % EX AERO
INHALATION_SPRAY | CUTANEOUS | Status: DC | PRN
  Administered 2014-01-04: 2 via TOPICAL

## 2014-01-04 MED ORDER — MORPHINE SULFATE 2 MG/ML IJ SOLN
1.0000 mg | INTRAMUSCULAR | Status: DC | PRN
  Administered 2014-01-06: 1 mg via INTRAVENOUS
  Filled 2014-01-04: qty 1

## 2014-01-04 MED ORDER — ONDANSETRON HCL 4 MG/2ML IJ SOLN: 4.0000 mg | Freq: Four times a day (QID) | INTRAMUSCULAR | Status: DC | PRN

## 2014-01-04 MED ORDER — SODIUM CHLORIDE 0.9 % IV SOLN: INTRAVENOUS | Status: DC

## 2014-01-04 MED ORDER — SODIUM CHLORIDE 0.9 % IV BOLUS (SEPSIS)
500.0000 mL | Freq: Once | INTRAVENOUS | Status: AC
  Administered 2014-01-04: 500 mL via INTRAVENOUS

## 2014-01-04 MED ORDER — SODIUM CHLORIDE 0.9 % IV SOLN
80.0000 mg | Freq: Once | INTRAVENOUS | Status: AC
  Administered 2014-01-04: 80 mg via INTRAVENOUS
  Filled 2014-01-04: qty 80

## 2014-01-04 MED ORDER — ZOLPIDEM TARTRATE 5 MG PO TABS
5.0000 mg | ORAL_TABLET | Freq: Every evening | ORAL | Status: DC | PRN
  Administered 2014-01-04: 5 mg via ORAL
  Filled 2014-01-04: qty 1

## 2014-01-04 MED ORDER — MIDAZOLAM HCL 5 MG/ML IJ SOLN
INTRAMUSCULAR | Status: AC
  Filled 2014-01-04: qty 2

## 2014-01-04 MED ORDER — DIPHENHYDRAMINE HCL 50 MG/ML IJ SOLN
INTRAMUSCULAR | Status: AC
  Filled 2014-01-04: qty 1

## 2014-01-04 MED ORDER — MIDAZOLAM HCL 5 MG/5ML IJ SOLN
INTRAMUSCULAR | Status: DC | PRN
  Administered 2014-01-04: 2 mg via INTRAVENOUS
  Administered 2014-01-04: 1 mg via INTRAVENOUS
  Administered 2014-01-04 (×2): 2 mg via INTRAVENOUS
  Administered 2014-01-04: 1 mg via INTRAVENOUS

## 2014-01-04 MED ORDER — FENTANYL CITRATE 0.05 MG/ML IJ SOLN
INTRAMUSCULAR | Status: DC | PRN
  Administered 2014-01-04: 25 ug via INTRAVENOUS
  Administered 2014-01-04 (×2): 12.5 ug via INTRAVENOUS

## 2014-01-04 MED ORDER — OXYCODONE HCL 5 MG PO TABS: 5.0000 mg | ORAL_TABLET | ORAL | Status: DC | PRN

## 2014-01-04 MED ORDER — SODIUM CHLORIDE 0.9 % IV SOLN
INTRAVENOUS | Status: DC
  Administered 2014-01-04 – 2014-01-05 (×4): via INTRAVENOUS

## 2014-01-04 MED ORDER — SODIUM CHLORIDE 0.9 % IV SOLN
8.0000 mg/h | INTRAVENOUS | Status: DC
  Administered 2014-01-04: 8 mg/h via INTRAVENOUS
  Filled 2014-01-04 (×3): qty 80

## 2014-01-04 MED ORDER — SODIUM CHLORIDE 0.9 % IV SOLN: Freq: Once | INTRAVENOUS | Status: DC

## 2014-01-04 MED ORDER — PEG 3350-KCL-NA BICARB-NACL 420 G PO SOLR
4000.0000 mL | Freq: Once | ORAL | Status: AC
  Administered 2014-01-04: 4000 mL via ORAL
  Filled 2014-01-04: qty 4000

## 2014-01-04 MED ORDER — ACETAMINOPHEN 650 MG RE SUPP: 650.0000 mg | Freq: Four times a day (QID) | RECTAL | Status: DC | PRN

## 2014-01-04 MED ORDER — PANTOPRAZOLE SODIUM 40 MG IV SOLR
40.0000 mg | Freq: Two times a day (BID) | INTRAVENOUS | Status: DC
  Administered 2014-01-04: 40 mg via INTRAVENOUS
  Filled 2014-01-04 (×3): qty 40

## 2014-01-04 NOTE — Progress Notes (Signed)
Patient admitted after midnight- please see H&P.  Will change protonix to IV ggt- 2 units ready to transfuse.  Await most recent Hgb.  Await GI eval  [name]

## 2014-01-04 NOTE — H&P (Signed)
[facility]                                                                                    Patient Demographics  Darren Sullivan, is a 48 y.o. male  CSN: [id number]  MRN: [id number]  DOB - 04-30-1965  Admit Date - 01/03/2014  Outpatient Primary MD for the patient is No primary provider on file.   With History of -  History reviewed. No pertinent past medical history.    Past Surgical History  Procedure Laterality Date  . Urethra recontruction surgery    . Urethra surgery    . Back surgery    . Eye surgery    . Hand surgery      in for   Chief Complaint  Patient presents with  . GI Bleeding     HPI  Darren Sullivan  is a 48 y.o. male, with past medical history significant for chronic back pain on Aleve twice a day for years, presenting with one-day history of bloody stools with diarrhea. No recent history of antibiotics use. Denies any nausea or vomiting but reports some mild lower abdominal discomfort. No history of peptic ulcer disease or previous GI bleed. Patient denies any chest pains or shortness of breath. Patient has one kidney(congenital) . Patient reports feeling chills and his upper body    Review of Systems    In addition to the HPI above,  No Headache, No changes with Vision or hearing, No problems swallowing food or Liquids, No Chest pain, Cough or Shortness of Breath, No Nausea or Vommitting, Bowel movements are regular, No dysuria, No new skin rashes or bruises, No new joints pains-aches,  No new weakness, tingling, numbness in any extremity, No recent weight gain or loss, No polyuria, polydypsia or polyphagia, No significant Mental Stressors.  A full 10 point Review of Systems was done, except as stated above, all other Review of Systems were negative.   Social History History  Substance Use Topics  . Smoking status: Never Smoker   . Smokeless tobacco: Not on file  . Alcohol Use: No     Family History History  reviewed. No pertinent family history.   Prior to Admission medications   Medication Sig Start Date End Date Taking? Authorizing Provider  naproxen sodium (ANAPROX) 220 MG tablet Take 220 mg by mouth 2 (two) times daily with a meal.   Yes Historical Provider, MD    Allergies  Allergen Reactions  . Penicillins Hives    Physical Exam  Vitals  Blood pressure 109/59, pulse 77, temperature 98.6 F (37 C), temperature source Oral, resp. rate 11, SpO2 100.00%.   1. General African American male looks chronically ill .  2. Normal affect and insight, Not Suicidal or Homicidal, Awake Alert, Oriented X 3.  3. No F.N deficits, ALL C.Nerves Intact, Strength 5/5 all 4 extremities, Sensation intact all 4 extremities, Plantars down going.  4. Ears and Eyes appear Normal, Conjunctivae pale, PERRLA. Moist Oral Mucosa.  5. Supple Neck, No JVD, No cervical lymphadenopathy appriciated, No Carotid Bruits.  6. Symmetrical Chest wall movement, Good air movement bilaterally, CTAB.  7. RRR, No Gallops, Rubs or Murmurs,  No Parasternal Heave.  8. Positive Bowel Sounds, lower abdominal tenderness, No organomegaly appriciated,No rebound -guarding or rigidity.  9.  No Cyanosis, Normal Skin Turgor, No Skin Rash or Bruise.  10. Good muscle tone,  joints appear normal , no effusions, Normal ROM.  11. No Palpable Lymph Nodes in Neck or Axillae    Data Review  CBC  Recent Labs Lab 01/03/14 2207  WBC 11.6*  HGB 11.4*  HCT 35.7*  PLT 187  MCV 80.0  MCH 25.6*  MCHC 31.9  RDW 14.3   ------------------------------------------------------------------------------------------------------------------  Chemistries   Recent Labs Lab 01/03/14 2207  NA 143  K 4.3  CL 107  CO2 21  GLUCOSE 136*  BUN 20  CREATININE 1.20  CALCIUM 8.7  AST 17  ALT 25  ALKPHOS 46  BILITOT 0.9    ------------------------------------------------------------------------------------------------------------------ CrCl is unknown because both a height and weight (above a minimum accepted value) are required for this calculation. ------------------------------------------------------------------------------------------------------------------ No results found for this basename: TSH, T4TOTAL, FREET3, T3FREE, THYROIDAB,  in the last 72 hours   Coagulation profile No results found for this basename: INR, PROTIME,  in the last 168 hours ------------------------------------------------------------------------------------------------------------------- No results found for this basename: DDIMER,  in the last 72 hours -------------------------------------------------------------------------------------------------------------------  Cardiac Enzymes No results found for this basename: CK, CKMB, TROPONINI, MYOGLOBIN,  in the last 168 hours ------------------------------------------------------------------------------------------------------------------ No components found with this basename: POCBNP,    ---------------------------------------------------------------------------------------------------------------   Imaging results:   No results found.  My personal review of EKG: Normal sinus rhythm at 89 beats per minute with ventricular conduction delay/incomplete left bundle branch block    Assessment & Plan  1. GI bleed/nonsteroidal induced gastropathy 2. Chronic renal failure stage II , with history of congenital 1 kidney 3. Chronic Back pain status post spinal fusion  Plan  Admit to telemetry IV fluids IV Protonix twice a day Gastroenterology consulted N.p.o. after midnight Stop nonsteroidals    DVT Prophylaxis SCDs  AM Labs Ordered, also please review Full Orders  Family Communication: Admission, patients condition and plan of care including tests being ordered  have been discussed with the patient and wife who indicate understanding and agree with the plan and Code Status.  Code Status full  Disposition Plan: Home  Time spent in minutes : 31 minutes  Condition GUARDED   @SIGNATURE @

## 2014-01-04 NOTE — Progress Notes (Signed)
Endoscopy negative, sigmoidoscopy shows lots of left-sided diverticula and fresh blood without active bleeding (please see dictated procedure report).  Plan: Prep colonoscopy tomorrow  Florencia Reasonsobert V. Sunjai Levandoski, M.D. [phone]

## 2014-01-04 NOTE — Op Note (Signed)
[facility] [address] [place], [id number]   ENDOSCOPY PROCEDURE REPORT  PATIENT: Darren Sullivan, Darren Sullivan  MR#: [id number] BIRTHDATE: 05/21/1965 , 48  yrs. old GENDER: male ENDOSCOPIST:Ceaser Ebeling, MD REFERRED BY: None PROCEDURE DATE:  01/04/2014 PROCEDURE:   EGD, diagnostic ASA CLASS:    Class III INDICATIONS: Hematochezia with anemia. MEDICATION: Fentanyl 25 mcg, Versed 3 mg IV TOPICAL ANESTHETIC:   Cetacaine Spray  DESCRIPTION OF PROCEDURE:   After the risks and benefits of the procedure were explained, informed consent was obtained.  The Pentax Gastroscope [id number]  endoscope was introduced through the mouth  and advanced to the second portion of the duodenum .  The instrument was slowly withdrawn as the mucosa was fully examined.    The Pentax adult video endoscope was passed under direct vision. The larynx was not well seen but the esophagus was entered without undue difficulty.  The esophagus was normal, apart from a widely patent Schatzki's ring, below which was a 2 cm hiatal hernia.  No Mallory-Weiss tear or, infection, varices, Barrett's esophagus, reflux esophagitis, or neoplasia were observed.  The stomach contained a small bilious residual, no blood or coffee-ground material.  There was some punctate erythema in the antrum, but no erosions or ulcers to suggest significant gastropathy arising from his frequent use of Aleve.  No polyps or masses were seen, and a retroflexed view of the cardia was normal.  The pylorus, duodenal bulb, and second duodenum looked normal.  The scope was removed from the patient.  No biopsies were obtained. He tolerated the procedure well.          The scope was then withdrawn from the patient and the procedure completed.  COMPLICATIONS: There were no immediate complications.  ENDOSCOPIC IMPRESSION: 1. Normal upper endoscopy 2. No source of medication endoscopically evident 3. No evidence of NSAID  gastropathy  RECOMMENDATIONS: proceed to sigmoidoscopic evaluation   _______________________________ eSignedBernette Redbird:  Jamal Haskin, MD 01/04/2014 3:14 PM     cc:  CPT CODES: ICD CODES:  The ICD and CPT codes recommended by this software are interpretations from the data that the clinical staff has captured with the software.  The verification of the translation of this report to the ICD and CPT codes and modifiers is the sole responsibility of the health care institution and practicing physician where this report was generated.  [employer] will not be held responsible for the validity of the ICD and CPT codes included on this report.  AMA assumes no liability for data contained or not contained herein. CPT is a Publishing rights managerregistered trademark of the [employer].  PATIENT NAME:  Darren Sullivan, Darren Sullivan MR#: [id number]

## 2014-01-04 NOTE — Progress Notes (Signed)
Pt admitted to the unit. Pt is alert and oriented. Pt oriented to room, staff, and call bell. Educated on fall safety plan. Bed in lowest position. Full assessment to Epic. Call bell with in reach. Educated to call for assists. Will continue to monitor. Elizandro Laura, RN 

## 2014-01-04 NOTE — Progress Notes (Addendum)
Pt with b/p reading 83/44. Manual recheck = 78/52. Remains asymptomatic. No active signs of bleeding. Page to K. Schorr for notification.  New order for 500 cc fluid bolus. Dondra SpryMoore, Marcin Holte Islee, RN

## 2014-01-04 NOTE — Consult Note (Signed)
Referring Provider: Dr. Marlin CanaryJessica Vann Primary Care Physician:  None (unassigned) Primary Gastroenterologist:  None  Reason for Consultation:  GI bleeding  HPI: Darren Sullivan is a 48 y.o. male who had approximately 5 episodes of voluminous red blood per rectum beginning around 7 PM yesterday evening, associated with syncope. No further bleeding since that time. He has been stable, except for lowish blood pressures, since coming to the emergency room last night, and his hemoglobin has leveled off at 8.7.  There is no prior history of ulcer disease or GI bleeding. He did not have significant prodromal dyspeptic symptoms, but he does use Aleve on a regular, daily basis. He has never had endoscopy or colonoscopy.  Of note, his BUN has been consistently normal since the time of arrival at the emergency room.  The patient is a nonsmoker and nondrinker.   History reviewed. No pertinent past medical history.  Past Surgical History  Procedure Laterality Date  . Urethra recontruction surgery    . Urethra surgery    . Back surgery    . Eye surgery    . Hand surgery      Prior to Admission medications   Medication Sig Start Date End Date Taking? Authorizing Provider  naproxen sodium (ANAPROX) 220 MG tablet Take 220 mg by mouth 2 (two) times daily with a meal.   Yes Historical Provider, MD    Current Facility-Administered Medications  Medication Dose Route Frequency Provider Last Rate Last Dose  . 0.9 %  sodium chloride infusion   Intravenous Continuous Carron CurieAli Hijazi, MD 100 mL/hr at 01/04/14 0307    . 0.9 %  sodium chloride infusion   Intravenous Once Carron CurieAli Hijazi, MD      . 0.9 %  sodium chloride infusion   Intravenous Once [name], DO      . acetaminophen (TYLENOL) tablet 650 mg  650 mg Oral Q6H PRN Carron CurieAli Hijazi, MD       Or  . acetaminophen (TYLENOL) suppository 650 mg  650 mg Rectal Q6H PRN Carron CurieAli Hijazi, MD      . morphine 2 MG/ML injection 1 mg  1 mg Intravenous Q4H PRN Carron CurieAli Hijazi, MD       . ondansetron (ZOFRAN) tablet 4 mg  4 mg Oral Q6H PRN Carron CurieAli Hijazi, MD       Or  . ondansetron (ZOFRAN) injection 4 mg  4 mg Intravenous Q6H PRN Carron CurieAli Hijazi, MD      . oxyCODONE (Oxy IR/ROXICODONE) immediate release tablet 5 mg  5 mg Oral Q4H PRN Carron CurieAli Hijazi, MD      . pantoprazole (PROTONIX) 80 mg in sodium chloride 0.9 % 250 mL (0.32 mg/mL) infusion  8 mg/hr Intravenous Continuous [name], DO 25 mL/hr at 01/04/14 1021 8 mg/hr at 01/04/14 1021  . [START ON 01/07/2014] pantoprazole (PROTONIX) injection 40 mg  40 mg Intravenous Q12H Jessica U Vann, DO      . sodium chloride 0.9 % injection 3 mL  3 mL Intravenous Q12H Carron CurieAli Hijazi, MD      . sodium chloride 0.9 % injection 3 mL  3 mL Intravenous Q12H Carron CurieAli Hijazi, MD      . zolpidem (AMBIEN) tablet 5 mg  5 mg Oral QHS PRN,MR X 1 Carron CurieAli Hijazi, MD   5 mg at 01/04/14 0309    Allergies as of 01/03/2014 - Review Complete 01/03/2014  Allergen Reaction Noted  . Penicillins Hives 10/21/2012    History reviewed. No pertinent family history.  History  Social History  . Marital Status: Married    Spouse Name: N/A    Number of Children: N/A  . Years of Education: N/A   Occupational History  .  does office work, as a Software engineercollections agent    Social History Main Topics  . Smoking status: Never Smoker   . Smokeless tobacco: Not on file  . Alcohol Use: No  . Drug Use: No  . Sexual Activity: Not on file   Other Topics Concern  . Not on file   Social History Narrative  . No narrative on file    Review of Systems:  Negative except for slow and is of urination related to a long-standing urethral stricture. No dysphagia or reflux, chronic abdominal pain or nausea, headaches or dizziness or focal neurologic symptoms, chest pain, cough, or shortness of breath, joint pains or skin rashes, hematuria or constipation  Physical Exam: Vital signs in last 24 hours: Temp:  [98.1 F (36.7 C)-98.6 F (37 C)] 98.1 F (36.7 C) (10/15 0921) Pulse Rate:   [65-101] 65 (10/15 0921) Resp:  [11-26] 16 (10/15 0921) BP: (72-117)/(36-76) 90/51 mmHg (10/15 0921) SpO2:  [96 %-100 %] 97 % (10/15 0921) Weight:  [73.528 kg (162 lb 1.6 oz)] 73.528 kg (162 lb 1.6 oz) (10/15 0226) Last BM Date: 01/03/14 General:   Alert,  Well-developed, well-nourished, pleasant and cooperative in NAD Head:  Normocephalic and atraumatic. Eyes:  Sclera clear, no icterus.   Conjunctiva pink. Neck:   No masses or thyromegaly. Lungs:  Clear throughout to auscultation.   No wheezes, crackles, or rhonchi. No evident respiratory distress. Heart:   Regular rate and rhythm; no murmurs, clicks, rubs,  or gallops. Abdomen:  Soft, nontender, and nondistended. No masses, hepatosplenomegaly or ventral hernias noted. Normal bowel sounds, without bruits, guarding, or rebound.   Msk:   Symmetrical without gross deformities. Pulses:  Normal radial pulse noted. Extremities:   Without clubbing, cyanosis, or edema. Neurologic:  Alert and coherent;  grossly normal neurologically. Skin:  Intact without significant lesions or rashes. Cervical Nodes:  No significant cervical adenopathy. Psych:   Alert and cooperative. Normal mood and affect.  Intake/Output from previous day:   Intake/Output this shift:    Lab Results:  Recent Labs  01/03/14 2207 01/04/14 0328 01/04/14 0812  WBC 11.6*  --   --   HGB 11.4* 8.8* 8.7*  HCT 35.7*  --   --   PLT 187  --   --    BMET  Recent Labs  01/03/14 2207  NA 143  K 4.3  CL 107  CO2 21  GLUCOSE 136*  BUN 20  CREATININE 1.20  CALCIUM 8.7   LFT  Recent Labs  01/03/14 2207  PROT 6.5  ALBUMIN 3.5  AST 17  ALT 25  ALKPHOS 46  BILITOT 0.9   PT/INR No results found for this basename: LABPROT, INR,  in the last 72 hours  Studies/Results: No results found.  Impression: Significant GI bleed of indeterminate origin. The fact that he had syncope, and is on nonsteroidal anti-inflammatory medication, suggests an upper tract source,  although his normal BUN would go against that.  Plan: Endoscopy and unprepped colonoscopy/sigmoidoscopy this afternoon. Ashby DawesNature, purpose, and risks reviewed, and patient agreeable. Further management to depend on those findings. In the meantime, continue Protonix infusion and keep patient n.p.o.   LOS: 1 day   Octavion Mollenkopf V  01/04/2014, 12:08 PM

## 2014-01-04 NOTE — Op Note (Signed)
[facility] [address] [place], [id number]   FLEXIBLE SIGMOIDOSCOPY PROCEDURE REPORT  PATIENT: Darren Sullivan, Darren Sullivan  MR#: [id number] BIRTHDATE: 1965-06-13 , 48  yrs. old GENDER: male ENDOSCOPIST: Bernette Redbirdobert Bryah Ocheltree, MD REFERRED BY:  None (unassigned) PROCEDURE DATE:  01/04/2014 PROCEDURE:   Sigmoidoscopy, diagnostic ASA CLASS:   Class III INDICATIONS:hematochezia, anemia. MEDICATIONS: fentanyl 50 mcg, Versed 8 mg IV   (including medication per EGD which preceded this examination)  DESCRIPTION OF PROCEDURE:   After the risks benefits and alternatives of the procedure were thoroughly explained, informed consent was obtained.  Digital exam revealed no prostatic nodules. The [id number] ([id number]  endoscope was introduced through the anus and advanced to the descending colon to an insertion depth of approximately 50 cm, The exam was Without limitations.    The quality of the prep was The overall prep quality was adequate. . The instrument was then slowly withdrawn as the mucosa was examined.         COLON FINDINGS: There was a large amount of blood throughout the rectosigmoid lumen and up to the limit of the exam, which was felt to be the proximal descending colon.  Although some of the blood but appeared to be quite fresh and brighter red in color, most of the blood was old, burgundy or maroon clotted blood.  No active bleeding was identified on this exam, despite careful inspection.  The patient had numerous diverticula in the descending colon.  I did not see a protuberant clot or any other discrete stigmata of hemorrhage associated with any of these diverticula.  No other colonic abnormalities were seen, taking into account that the presence of blood could have obscured small or focal abnormalities.  Specifically, I did not see any polyps, masses, colitis, or vascular ectasia.  Retroflexion was not performed in the rectum.             The scope was  then withdrawn from the patient and the procedure terminated.  COMPLICATIONS: There were no immediate complications.  ENDOSCOPIC IMPRESSION:  Probable recent, but inactive, diverticular bleed.  There was a large amount of blood throughout the rectosigmoid lumen and up to the limit of the exam, which was felt to be the proximal descending colon.  Although some of the blood but appeared to be quite fresh and brighter red in color, most of the blood was old, burgundy or maroon clotted blood.  No active bleeding was identified on this exam, despite careful inspection.  The patient had numerous diverticula in the descending colon.  I did not see a protuberant clot or any other discrete stigmata of hemorrhage associated with any of these diverticula.  No other colonic abnormalities were seen, taking into account that the presence of blood could have obscured small or focal abnormalities.  Specifically, I did not see any polyps, masses, colitis, or vascular ectasia.  Retroflexion was not performed in the rectum  RECOMMENDATIONS: do a fully prepped complete colonoscopy tomorrow, to exclude proximal lesions and also to check for evidence of any discrete diverticular origin of his recent bleeding.  REPEAT EXAM: tomorrow  eSigned:  Bernette Redbirdobert Emarion Toral, MD 01/04/2014 3:41 PM   CC:  PATIENT NAME:  Darren Sullivan, Darren Sullivan MR#: [id number]

## 2014-01-05 ENCOUNTER — Encounter (HOSPITAL_COMMUNITY): Payer: Self-pay | Admitting: Gastroenterology

## 2014-01-05 ENCOUNTER — Inpatient Hospital Stay (HOSPITAL_COMMUNITY): Payer: Medicaid Other | Admitting: Anesthesiology

## 2014-01-05 ENCOUNTER — Encounter (HOSPITAL_COMMUNITY): Payer: Medicaid Other | Admitting: Anesthesiology

## 2014-01-05 ENCOUNTER — Encounter (HOSPITAL_COMMUNITY)
Admission: EM | Disposition: A | Discharge status: Home / Self Care | Payer: Self-pay | Source: Home / Self Care | Attending: Internal Medicine

## 2014-01-05 DIAGNOSIS — K5791 Diverticulosis of intestine, part unspecified, without perforation or abscess with bleeding: Secondary | ICD-10-CM

## 2014-01-05 DIAGNOSIS — D62 Acute posthemorrhagic anemia: Secondary | ICD-10-CM

## 2014-01-05 HISTORY — PX: COLONOSCOPY WITH PROPOFOL: SHX5780

## 2014-01-05 LAB — CBC
HEMATOCRIT: 27.9 % — AB (ref 39.0–52.0)
Hemoglobin: 9.1 g/dL — ABNORMAL LOW (ref 13.0–17.0)
MCH: 26 pg (ref 26.0–34.0)
MCHC: 32.6 g/dL (ref 30.0–36.0)
MCV: 79.7 fL (ref 78.0–100.0)
Platelets: 147 10*3/uL — ABNORMAL LOW (ref 150–400)
RBC: 3.5 MIL/uL — ABNORMAL LOW (ref 4.22–5.81)
RDW: 14.3 % (ref 11.5–15.5)
WBC: 6.4 10*3/uL (ref 4.0–10.5)

## 2014-01-05 LAB — HEMOGLOBIN
Hemoglobin: 7.9 g/dL — ABNORMAL LOW (ref 13.0–17.0)
Hemoglobin: 8.1 g/dL — ABNORMAL LOW (ref 13.0–17.0)

## 2014-01-05 SURGERY — COLONOSCOPY WITH PROPOFOL
Anesthesia: Monitor Anesthesia Care

## 2014-01-05 MED ORDER — LACTATED RINGERS IV SOLN
INTRAVENOUS | Status: DC | PRN
  Administered 2014-01-05: 10:00:00 via INTRAVENOUS

## 2014-01-05 MED ORDER — PROPOFOL INFUSION 10 MG/ML OPTIME
INTRAVENOUS | Status: DC | PRN
  Administered 2014-01-05: 120 ug/kg/min via INTRAVENOUS

## 2014-01-05 MED ORDER — FENTANYL CITRATE 0.05 MG/ML IJ SOLN
INTRAMUSCULAR | Status: DC | PRN
  Administered 2014-01-05 (×2): 50 ug via INTRAVENOUS
  Administered 2014-01-05: 25 ug via INTRAVENOUS

## 2014-01-05 MED ORDER — MIDAZOLAM HCL 5 MG/5ML IJ SOLN
INTRAMUSCULAR | Status: DC | PRN
  Administered 2014-01-05: 2 mg via INTRAVENOUS

## 2014-01-05 MED ORDER — MENTHOL 3 MG MT LOZG
1.0000 | LOZENGE | OROMUCOSAL | Status: DC | PRN
  Administered 2014-01-05: 3 mg via ORAL
  Filled 2014-01-05: qty 9

## 2014-01-05 MED ORDER — LACTATED RINGERS IV SOLN
INTRAVENOUS | Status: DC
  Administered 2014-01-05: 10:00:00 via INTRAVENOUS

## 2014-01-05 MED ORDER — PHENYLEPHRINE HCL 10 MG/ML IJ SOLN
INTRAMUSCULAR | Status: DC | PRN
  Administered 2014-01-05 (×3): 120 ug via INTRAVENOUS

## 2014-01-05 MED ORDER — FENTANYL CITRATE 0.05 MG/ML IJ SOLN: 25.0000 ug | INTRAMUSCULAR | Status: DC | PRN

## 2014-01-05 MED ORDER — PANTOPRAZOLE SODIUM 40 MG PO TBEC
40.0000 mg | DELAYED_RELEASE_TABLET | Freq: Every day | ORAL | Status: DC
  Administered 2014-01-05 – 2014-01-06 (×2): 40 mg via ORAL
  Filled 2014-01-05 (×2): qty 1

## 2014-01-05 NOTE — Op Note (Signed)
[facility] [address] [place], [id number]   COLONOSCOPY PROCEDURE REPORT  PATIENT: Darren Sullivan, Darren Sullivan  MR#: [id number] BIRTHDATE: [birth year] , 48  yrs. old GENDER: male ENDOSCOPIST: Bernette Redbirdobert Edye Hainline, MD REFERRED BY:  None (unassigned) PROCEDURE DATE:  01/05/2014 PROCEDURE:   Colonoscopy, diagnostic ASA CLASS:   Class III INDICATIONS:hematochezia.  diverticular disease noted on yesterday's sigmoidoscopy, with negative upper endoscopy MEDICATIONS: Monitored anesthesia care  DESCRIPTION OF PROCEDURE:   After the risks and benefits and of the procedure were explained, informed consent was obtained.  rectal exam revealed no prostatic nodules.    The Pentax Ultra Slim [id number]  endoscope was introduced through the anus and advanced to the terminal ileum which was intubated for a short distance .  The quality of the prep was excellent. .  The instrument was then slowly withdrawn as the colon was fully examined.     COLON FINDINGS: Perianal exam and digital rectal exam the prostate were unremarkable; the Pentax ultraslim colonoscope was advanced without difficulty around the colon, using some external abdominal compression to enter the cecum and briefly glimpse into the terminal ileum, which had a normal appearance.  Pullback was then performed.  There was no blood whatsoever in the colonic lumen.  There were a few small clots which had been extruded from diverticula.  The patient had scattered mild pancolonic diverticulosis, with at least one diverticulum noted in the area just abovethe cecum, and other scattered around the remainder of the colon, including the sigmoid region.  None of these diverticula had a clot or other stigma of hemorrhage that I could identify.  The colonoscopy was otherwise normal.  The patient did not have masses, vascular malformations, polyps, or mucosal inflammation. Retroflexion in the rectum and reinspection of the rectum  were normal, and pullout through the anal canal showed just mild internal hemorrhoids.  The patient tolerated the procedure well.  No biopsies were obtained.            The scope was then withdrawn from the patient and the procedure completed.  WITHDRAWAL TIME:  COMPLICATIONS: There were no immediate complications.   ENDOSCOPIC IMPRESSION: 1. No active bleeding or blood in the colonic lumen at the time of this exam 2. Scattered pancolonic diverticulosis, which is the presumed source of the patient's bleeding, although the portion of the colon from which the bleeding occurred is not known 3. Otherwise normal colonoscopy to the terminal ileum  RECOMMENDATIONS: 1. Okay to advance diet 2. Clinical monitoring; the patient is at moderate risk (probably 30-40%) for rebleeding within the next one to 2 days  REPEAT EXAM: 10 years  cc:  _______________________________ eSignedBernette Redbird:  Raynard Mapps, MD 01/05/2014 11:06 AM   CPT CODES: ICD CODES:  The ICD and CPT codes recommended by this software are interpretations from the data that the clinical staff has captured with the software.  The verification of the translation of this report to the ICD and CPT codes and modifiers is the sole responsibility of the health care institution and practicing physician where this report was generated.  [employer] will not be held responsible for the validity of the ICD and CPT codes included on this report.  AMA assumes no liability for data contained or not contained herein. CPT is a Publishing rights managerregistered trademark of the [employer].   PATIENT NAME:  Darren Sullivan, Darren Sullivan MR#: [id number]

## 2014-01-05 NOTE — ED Provider Notes (Signed)
Medical screening examination/treatment/procedure(s) were conducted as a shared visit with non-physician practitioner(s) and myself.  I personally evaluated the patient during the encounter.   EKG Interpretation   Date/Time:  Wednesday January 03 2014 22:43:44 EDT Ventricular Rate:  89 PR Interval:  126 QRS Duration: 100 QT Interval:  358 QTC Calculation: 436 R Axis:   63 Text Interpretation:  Age not entered, assumed to be  48 years old for  purpose of ECG interpretation Sinus rhythm RSR' in V1 or V2, right VCD or  RVH No significant change since last tracing Confirmed by Ethelda ChickJACUBOWITZ  MD,  Elton Catalano [phone] on 01/03/2014 11:51:56 PM       Doug SouSam Aadyn Buchheit, MD 01/05/14 [id number]

## 2014-01-05 NOTE — Transfer of Care (Signed)
Immediate Anesthesia Transfer of Care Note  Patient: Darren Sullivan  Procedure(s) Performed: Procedure(s): COLONOSCOPY WITH PROPOFOL (N/A)  Patient Location: Endoscopy Unit  Anesthesia Type:MAC  Level of Consciousness: sedated  Airway & Oxygen Therapy: Patient Spontanous Breathing and Patient connected to face mask oxygen  Post-op Assessment: Report given to PACU RN and Post -op Vital signs reviewed and stable  Post vital signs: Reviewed and stable  Complications: No apparent anesthesia complications

## 2014-01-05 NOTE — Progress Notes (Signed)
Colonoscopy shows no bleeding whatsoever at the present time, but scattered diverticula, without other abnormalities. Presumably, this is a now-quiescent diverticular bleed.  I have advanced the patient's diet and decrease the frequency of blood draws.  Depending on how he does, discharge tomorrow might be possible, keeping in mind that it is not uncommon for diverticular bleeding to recur after several days of quiescence.  Florencia Reasonsobert V. Ketty Bitton, M.D. [phone]

## 2014-01-05 NOTE — Anesthesia Preprocedure Evaluation (Addendum)
Anesthesia Evaluation  Patient identified by MRN, date of birth, ID band Patient awake    Reviewed: Allergy & Precautions, H&P , NPO status , Patient's Chart, lab work & pertinent test results  History of Anesthesia Complications Negative for: history of anesthetic complications  Airway Mallampati: I TM Distance: >3 FB Neck ROM: Full    Dental  (+) Teeth Intact   Pulmonary neg shortness of breath, neg sleep apnea, neg COPDneg recent URI, Current Smoker,  breath sounds clear to auscultation        Cardiovascular negative cardio ROS  Rhythm:Regular     Neuro/Psych negative neurological ROS  negative psych ROS   GI/Hepatic Neg liver ROS, GI bleed   Endo/Other  negative endocrine ROS  Renal/GU negative Renal ROS     Musculoskeletal   Abdominal   Peds  Hematology  (+) anemia ,   Anesthesia Other Findings   Reproductive/Obstetrics                          Anesthesia Physical Anesthesia Plan  ASA: II  Anesthesia Plan: MAC   Post-op Pain Management:    Induction: Intravenous  Airway Management Planned: Simple Face Mask  Additional Equipment: None  Intra-op Plan:   Post-operative Plan:   Informed Consent: I have reviewed the patients History and Physical, chart, labs and discussed the procedure including the risks, benefits and alternatives for the proposed anesthesia with the patient or authorized representative who has indicated his/her understanding and acceptance.   Dental advisory given  Plan Discussed with: Anesthesiologist and CRNA  Anesthesia Plan Comments:         Anesthesia Quick Evaluation

## 2014-01-05 NOTE — Anesthesia Procedure Notes (Signed)
Procedure Name: MAC Date/Time: 01/05/2014 10:20 AM Performed by: Quentin OreWALKER, Marcellino Fidalgo E Pre-anesthesia Checklist: Patient identified, Emergency Drugs available, Suction available, Patient being monitored and Timeout performed Patient Re-evaluated:Patient Re-evaluated prior to inductionOxygen Delivery Method: Simple face mask Preoxygenation: Pre-oxygenation with 100% oxygen Intubation Type: IV induction Placement Confirmation: positive ETCO2

## 2014-01-05 NOTE — Anesthesia Postprocedure Evaluation (Signed)
  Anesthesia Post-op Note  Patient: Darren ParesAndrew Fichera  Procedure(s) Performed: Procedure(s): COLONOSCOPY WITH PROPOFOL (N/A)  Patient Location: Endoscopy Unit  Anesthesia Type:MAC  Level of Consciousness: awake  Airway and Oxygen Therapy: Patient Spontanous Breathing  Post-op Pain: none  Post-op Assessment: Post-op Vital signs reviewed, Patient's Cardiovascular Status Stable, Respiratory Function Stable, Patent Airway, No signs of Nausea or vomiting and Pain level controlled  Post-op Vital Signs: Reviewed and stable  Last Vitals:  Filed Vitals:   01/05/14 1115  BP: 111/51  Pulse: 69  Temp:   Resp: 12    Complications: No apparent anesthesia complications

## 2014-01-05 NOTE — Progress Notes (Addendum)
Chart reviewed.   [facility] PROGRESS NOTE  Darren Sullivan WUJ:[id number] DOB: 17-Dec-1965 DOA: 01/03/2014 PCP: No primary provider on file.  Assessment/Plan: GI bleed presumably due to limited diverticular bleed. Home tomorrow if hgb stable. D/c IVF and saline lock. Cancel second unit prbc ABLA: got 1 unit prbc Needs referral to PCP  HPI/Subjective: No bleeding. Ate solid lunch. No abd pain  Objective: Filed Vitals:   01/05/14 1115  BP: 111/51  Pulse: 69  Temp:   Resp: 12    Intake/Output Summary (Last 24 hours) at 01/05/14 1520 Last data filed at 01/05/14 1349  Gross per 24 hour  Intake 1376.5 ml  Output    400 ml  Net  976.5 ml   Filed Weights   01/04/14 0226 01/04/14 2012  Weight: 73.528 kg (162 lb 1.6 oz) 75.206 kg (165 lb 12.8 oz)    Exam:   General:  Asleep. Arousable. comfortable  Cardiovascular: RRR without MGR  Respiratory: CTA without WRR  Abdomen: S, NT, ND  Ext: no CCE  Basic Metabolic Panel:  Recent Labs Lab 01/03/14 2207  NA 143  K 4.3  CL 107  CO2 21  GLUCOSE 136*  BUN 20  CREATININE 1.20  CALCIUM 8.7   Liver Function Tests:  Recent Labs Lab 01/03/14 2207  AST 17  ALT 25  ALKPHOS 46  BILITOT 0.9  PROT 6.5  ALBUMIN 3.5   No results found for this basename: LIPASE, AMYLASE,  in the last 168 hours No results found for this basename: AMMONIA,  in the last 168 hours CBC:  Recent Labs Lab 01/03/14 2207  01/04/14 0812 01/04/14 1604 01/04/14 1950 01/05/14 0355 01/05/14 0730  WBC 11.6*  --   --   --   --   --   --   HGB 11.4*  < > 8.7* 8.3* 8.3* 7.9* 8.1*  HCT 35.7*  --   --   --   --   --   --   MCV 80.0  --   --   --   --   --   --   PLT 187  --   --   --   --   --   --   < > = values in this interval not displayed. Cardiac Enzymes: No results found for this basename: CKTOTAL, CKMB, CKMBINDEX, TROPONINI,  in the last 168 hours BNP (last 3 results) No results found for this basename: PROBNP,  in the last  8760 hours CBG: No results found for this basename: GLUCAP,  in the last 168 hours  No results found for this or any previous visit (from the past 240 hour(s)).   Studies: No results found.  Scheduled Meds: . sodium chloride   Intravenous Once  . sodium chloride   Intravenous Once  . pantoprazole  40 mg Oral Daily  . sodium chloride  3 mL Intravenous Q12H  . sodium chloride  3 mL Intravenous Q12H   Continuous Infusions:   Time spent: 25 minutes  Shawan Corella L  [facility] Pager [phone]. If 7PM-7AM, please contact night-coverage at [email or web address], password [email or web address] 01/05/2014, 3:20 PM  LOS: 2 days

## 2014-01-05 NOTE — Progress Notes (Signed)
Patient being transported to endoscopy. Blood transfusion continued.   Leanna BattlesEckelmann, Rowyn Spilde Eileen, RN.

## 2014-01-06 ENCOUNTER — Inpatient Hospital Stay (HOSPITAL_COMMUNITY): Payer: Medicaid Other

## 2014-01-06 DIAGNOSIS — R0781 Pleurodynia: Secondary | ICD-10-CM | POA: Diagnosis not present

## 2014-01-06 LAB — CBC
HEMATOCRIT: 27.3 % — AB (ref 39.0–52.0)
Hemoglobin: 9.2 g/dL — ABNORMAL LOW (ref 13.0–17.0)
MCH: 26.7 pg (ref 26.0–34.0)
MCHC: 33.7 g/dL (ref 30.0–36.0)
MCV: 79.1 fL (ref 78.0–100.0)
PLATELETS: 141 10*3/uL — AB (ref 150–400)
RBC: 3.45 MIL/uL — ABNORMAL LOW (ref 4.22–5.81)
RDW: 14.2 % (ref 11.5–15.5)
WBC: 5.5 10*3/uL (ref 4.0–10.5)

## 2014-01-06 LAB — TROPONIN I: Troponin I: <0.3 ng/mL (ref <0.30)

## 2014-01-06 LAB — D-DIMER, QUANTITATIVE: D-Dimer, Quant: 0.33 ug/mL-FEU (ref 0.00–0.48)

## 2014-01-06 MED ORDER — ACETAMINOPHEN 325 MG PO TABS: 650.0000 mg | ORAL_TABLET | Freq: Four times a day (QID) | ORAL | Status: AC | PRN

## 2014-01-06 MED ORDER — ASPIRIN EC 81 MG PO TBEC
81.0000 mg | DELAYED_RELEASE_TABLET | Freq: Once | ORAL | Status: AC
  Administered 2014-01-06: 81 mg via ORAL
  Filled 2014-01-06: qty 1

## 2014-01-06 NOTE — Progress Notes (Signed)
  [facility] PROGRESS NOTE  Juliann Paresndrew Lunde WGN:[id number] DOB: 01/25/1966 DOA: 01/03/2014 PCP: No primary provider on file.  Assessment/Plan: Pleuritic CP. Check EKG, d dimer, CXR, troponin. Place on tele. Morphine. ASA 81 mg x1. Hold off on discharge until clarified  GI bleed presumably due to limited diverticular bleed. Resolved  ABLA: got 1 unit prbc. H/h stable  Needs referral to PCP  HPI/Subjective: C/o sharp CP substernal and left sided. Worse with inspiration. Occurred after using BR 10 minutes ago. No cough, f/c, dyspnea. 2/10, comes and goes. No h/o same. No h/o heart or lung disease, no h/o blood clot. No leg pain or swelling.  Objective: Filed Vitals:   01/06/14 0450  BP: 104/65  Pulse: 67  Temp: 98.3 F (36.8 C)  Resp: 17    Intake/Output Summary (Last 24 hours) at 01/06/14 0905 Last data filed at 01/05/14 1823  Gross per 24 hour  Intake 1316.5 ml  Output      0 ml  Net 1316.5 ml   Filed Weights   01/04/14 0226 01/04/14 2012 01/05/14 2134  Weight: 73.528 kg (162 lb 1.6 oz) 75.206 kg (165 lb 12.8 oz) 73.528 kg (162 lb 1.6 oz)    Exam:   General:  Alert. Anxious.  Cardiovascular: RRR without MGR. No tenderness to chest wall  Respiratory: CTA without WRR  Abdomen: S, NT, ND  Ext: no CCE. No calf tenderness. Homans negative.  Basic Metabolic Panel:  Recent Labs Lab 01/03/14 2207  NA 143  K 4.3  CL 107  CO2 21  GLUCOSE 136*  BUN 20  CREATININE 1.20  CALCIUM 8.7   Liver Function Tests:  Recent Labs Lab 01/03/14 2207  AST 17  ALT 25  ALKPHOS 46  BILITOT 0.9  PROT 6.5  ALBUMIN 3.5   No results found for this basename: LIPASE, AMYLASE,  in the last 168 hours No results found for this basename: AMMONIA,  in the last 168 hours CBC:  Recent Labs Lab 01/03/14 2207  01/04/14 1950 01/05/14 0355 01/05/14 0730 01/05/14 1835 01/06/14 0607  WBC 11.6*  --   --   --   --  6.4 5.5  HGB 11.4*  < > 8.3* 7.9* 8.1* 9.1* 9.2*  HCT  35.7*  --   --   --   --  27.9* 27.3*  MCV 80.0  --   --   --   --  79.7 79.1  PLT 187  --   --   --   --  147* 141*  < > = values in this interval not displayed. Cardiac Enzymes: No results found for this basename: CKTOTAL, CKMB, CKMBINDEX, TROPONINI,  in the last 168 hours BNP (last 3 results) No results found for this basename: PROBNP,  in the last 8760 hours CBG: No results found for this basename: GLUCAP,  in the last 168 hours  No results found for this or any previous visit (from the past 240 hour(s)).   Studies: No results found.  Scheduled Meds: . pantoprazole  40 mg Oral Daily  . sodium chloride  3 mL Intravenous Q12H  . sodium chloride  3 mL Intravenous Q12H   Continuous Infusions:   Time spent: 35 minutes  Reg Bircher L  [facility] Pager [phone]. If 7PM-7AM, please contact night-coverage at [email or web address], password [email or web address] 01/06/2014, 9:05 AM  LOS: 3 days

## 2014-01-06 NOTE — Progress Notes (Signed)
Pt co chest pain of 2 with some tightness, sharp in nature.  Pt co of pain after breakfast.  Dr. Lendell CapriceSullivan in room advised pts chest pain.  Stat EKG in progress.  IV morphine 1 mg for pain given.

## 2014-01-06 NOTE — Progress Notes (Signed)
Pt discharge instructions given pt verbalized understanding.  VSS.  Denies pain.  Pt left floor ambulating accopanied by staff and family.

## 2014-01-07 LAB — TYPE AND SCREEN
ABO/RH(D): O POS
Antibody Screen: NEGATIVE
UNIT DIVISION: 0
Unit division: 0

## 2014-01-08 ENCOUNTER — Encounter (HOSPITAL_COMMUNITY): Payer: Self-pay | Admitting: Gastroenterology

## 2014-01-10 NOTE — Discharge Summary (Signed)
Physician Discharge Summary  Darren Sullivan ZOX:[id number] DOB: 05/26/1965 DOA: 01/03/2014  PCP: No primary provider on file.  Admit date: 01/03/2014 Discharge date: 01/06/2014  Time spent: greater than 30 minutes  Recommendations for Outpatient Follow-up:  1. Establish PCP  Discharge Diagnoses:  Principal Problem:   GI bleed, likely limited diverticular bleed Active Problems:   Acute blood loss anemia   Pleuritic chest pain   Discharge Condition: stable  Filed Weights   01/04/14 0226 01/04/14 2012 01/05/14 2134  Weight: 73.528 kg (162 lb 1.6 oz) 75.206 kg (165 lb 12.8 oz) 73.528 kg (162 lb 1.6 oz)    History of present illness:  48 y.o. male, with past medical history significant for chronic back pain on Aleve twice a day for years, presenting with one-day history of bloody stools with diarrhea. No recent history of antibiotics use. Denies any nausea or vomiting but reports some mild lower abdominal discomfort. No history of peptic ulcer disease or previous GI bleed. Patient denies any chest pains or shortness of breath. Patient has one kidney(congenital). Patient reports feeling chills  Hospital Course:  Admitted to hospitalists. GI consulted. hgb dropped to low of 7.9. Transfused 1 unit prbc. EGD negative. Colonoscopy showed no bleeding, diverticulosis. No further bleeding. Recommend avoid NSAIDs.  Developed pleuritic CP during hospitalization. D dimer, EKG, CXR, troponin normal. Resolved with morphine. Stable for discharge  Procedures:  EGD  colonoscopy  Consultations:  [facility]  Discharge Exam:  See progress note   Discharge Instructions   Discharge instructions    Complete by:  As directed   High fiber diet     Increase activity slowly    Complete by:  As directed           Current Discharge Medication List    START taking these medications   Details  acetaminophen (TYLENOL) 325 MG tablet Take 2 tablets (650 mg total) by mouth every 6 (six) hours as  needed for mild pain (or Fever >/= 101).      STOP taking these medications     naproxen sodium (ANAPROX) 220 MG tablet        Allergies  Allergen Reactions  . Penicillins Hives   Follow-up Information   [facility] 2 weeks. (or other primary care provider to check hemoglobin and establish PCP)    Specialty:  Family Medicine   Contact information:   [address] [place] [address] [phone]        The results of significant diagnostics from this hospitalization (including imaging, microbiology, ancillary and laboratory) are listed below for reference.    Significant Diagnostic Studies: Dg Chest 2 View  01/06/2014   CLINICAL DATA:  Pleuritic chest pain left-sided.  EXAM: CHEST  2 VIEW  COMPARISON:  08/01/2007  FINDINGS: Lungs are clear. Cardiomediastinal silhouette and remainder of the exam is unchanged.  IMPRESSION: No active cardiopulmonary disease.   Electronically Signed   By: Elberta Fortisaniel  Boyle M.D.   On: 01/06/2014 14:50    Microbiology: No results found for this or any previous visit (from the past 240 hour(s)).   Labs: Basic Metabolic Panel:  Recent Labs Lab 01/03/14 2207  NA 143  K 4.3  CL 107  CO2 21  GLUCOSE 136*  BUN 20  CREATININE 1.20  CALCIUM 8.7   Liver Function Tests:  Recent Labs Lab 01/03/14 2207  AST 17  ALT 25  ALKPHOS 46  BILITOT 0.9  PROT 6.5  ALBUMIN 3.5  No results found for this basename: LIPASE, AMYLASE,  in the last 168 hours No results found for this basename: AMMONIA,  in the last 168 hours CBC:  Recent Labs Lab 01/03/14 2207  01/04/14 1950 01/05/14 0355 01/05/14 0730 01/05/14 1835 01/06/14 0607  WBC 11.6*  --   --   --   --  6.4 5.5  HGB 11.4*  < > 8.3* 7.9* 8.1* 9.1* 9.2*  HCT 35.7*  --   --   --   --  27.9* 27.3*  MCV 80.0  --   --   --   --  79.7 79.1  PLT 187  --   --   --   --  147* 141*  < > = values in this interval not displayed. Cardiac Enzymes:  Recent  Labs Lab 01/06/14 1042  TROPONINI <0.30   BNP: BNP (last 3 results) No results found for this basename: PROBNP,  in the last 8760 hours CBG: No results found for this basename: GLUCAP,  in the last 168 hours     Signed:  Presley Gora L  [facility] 01/06/2014, 3:15 PM

## 2015-08-22 IMAGING — CR DG CHEST 2V
2 series · 2 of 2 positions shown · non-contrast
Comparison: 08/01/2007

CLINICAL DATA: Pleuritic chest pain left-sided.

EXAM:
CHEST  2 VIEW

[w chest pa]
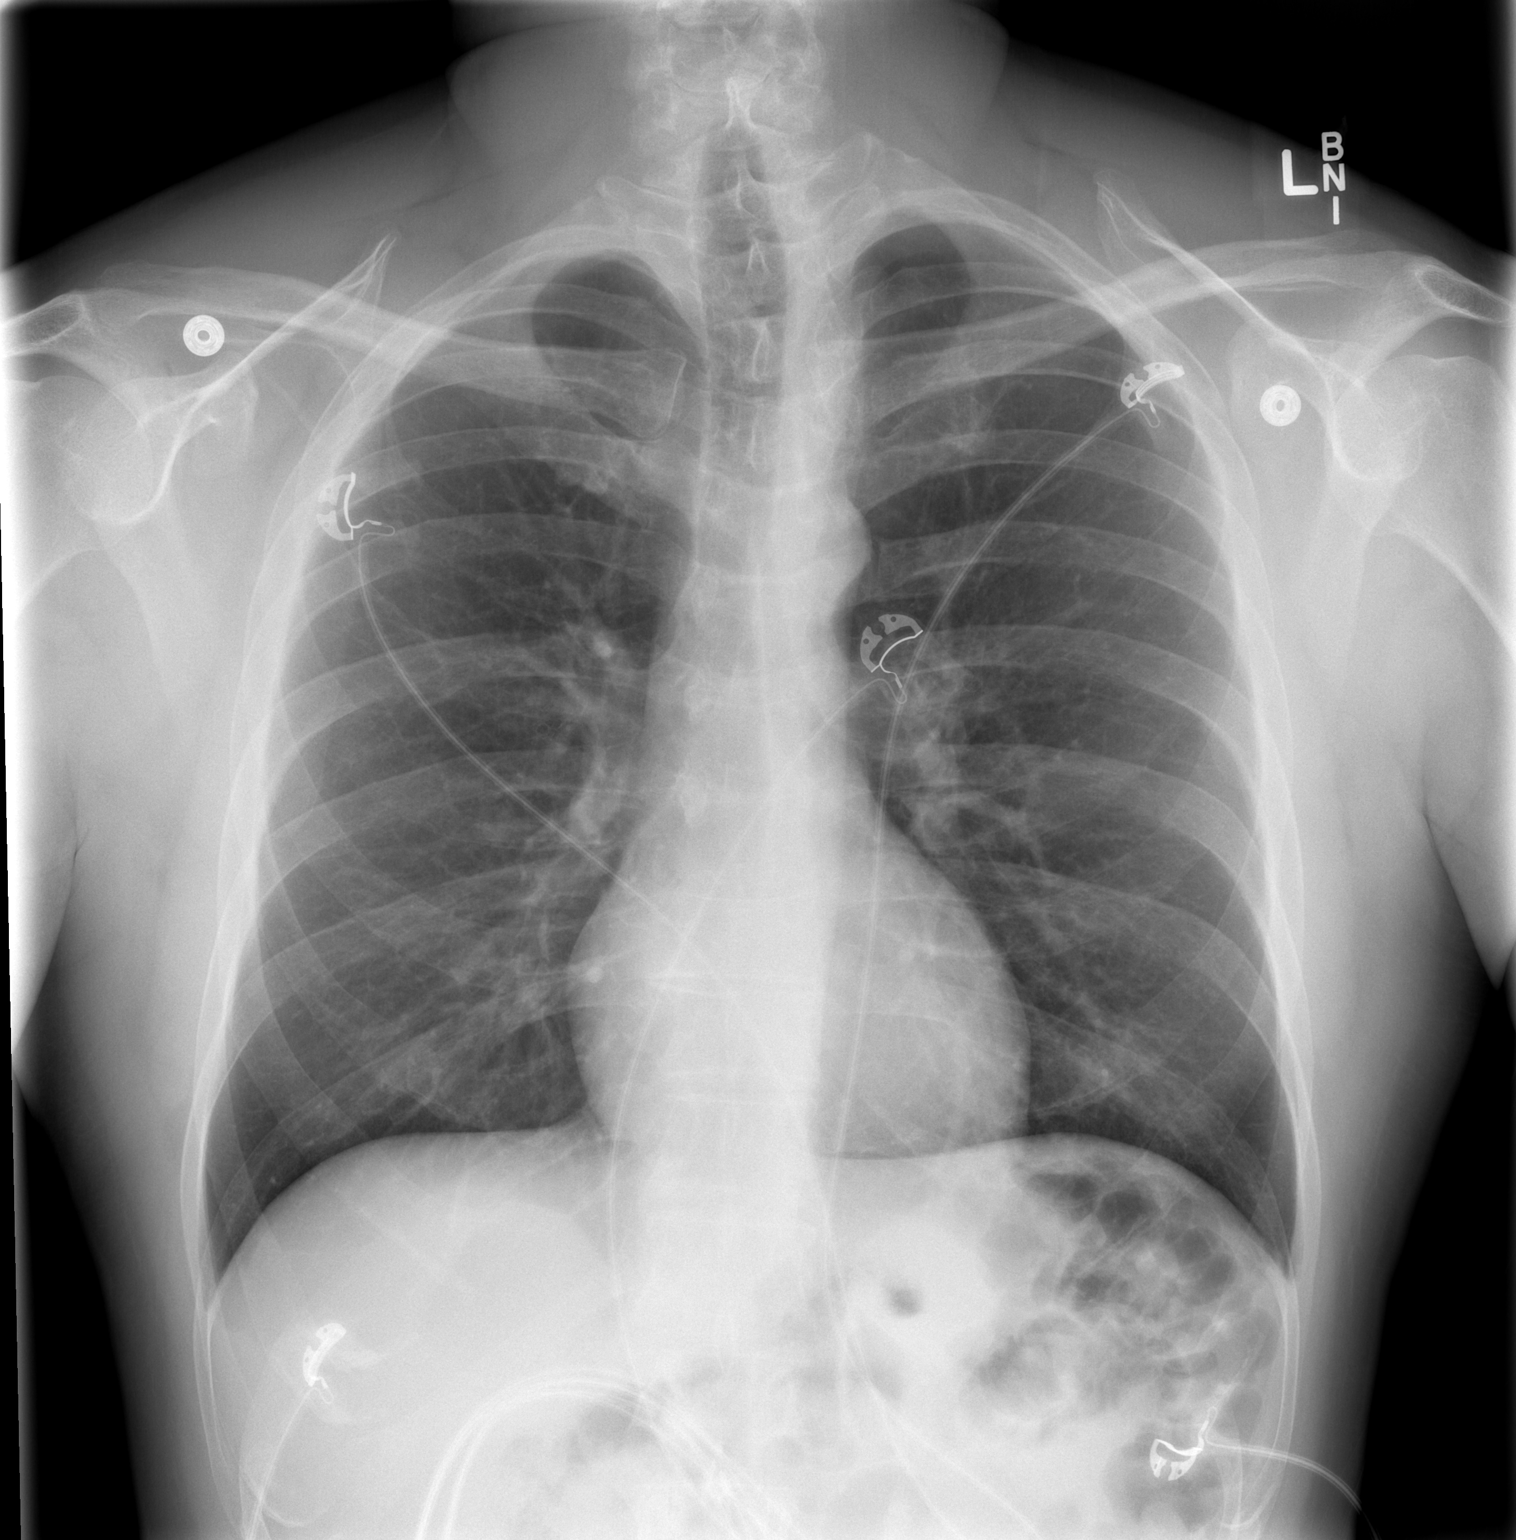

[w chest lat]
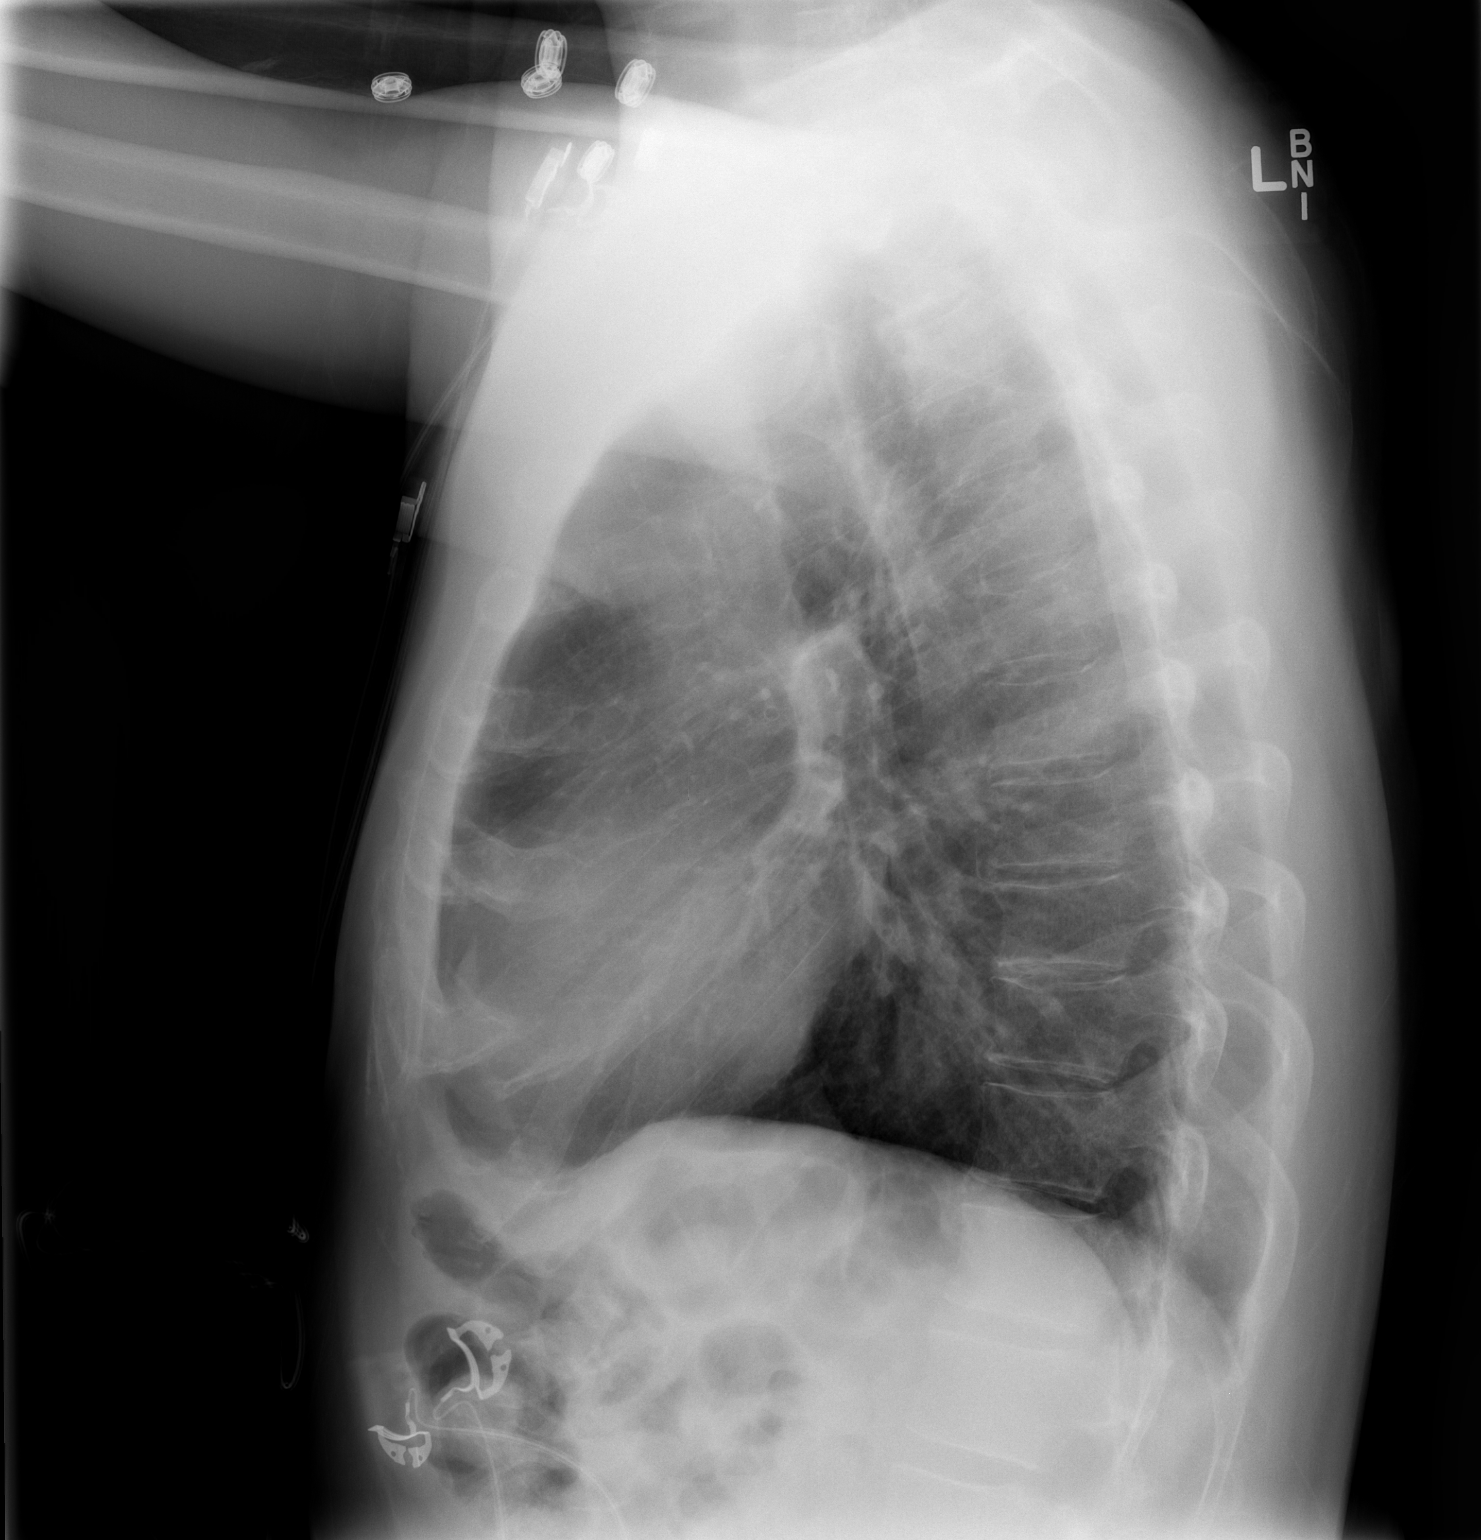

[2 of 2 positions shown; findings below may reference images not displayed]

FINDINGS: Lungs are clear. Cardiomediastinal silhouette and remainder of the
exam is unchanged.
IMPRESSION: No active cardiopulmonary disease.

## 2017-03-07 ENCOUNTER — Inpatient Hospital Stay (HOSPITAL_COMMUNITY)
Admission: EM | Admit: 2017-03-07 | Discharge: 2017-03-09 | DRG: 811 | Disposition: A | Discharge status: Home / Self Care | Payer: Medicaid Other | Attending: Family Medicine | Admitting: Family Medicine

## 2017-03-07 ENCOUNTER — Emergency Department (HOSPITAL_COMMUNITY): Payer: Medicaid Other

## 2017-03-07 ENCOUNTER — Encounter (HOSPITAL_COMMUNITY): Payer: Self-pay | Admitting: Emergency Medicine

## 2017-03-07 DIAGNOSIS — D62 Acute posthemorrhagic anemia: Principal | ICD-10-CM | POA: Diagnosis present

## 2017-03-07 DIAGNOSIS — Z880 Allergy status to penicillin: Secondary | ICD-10-CM

## 2017-03-07 DIAGNOSIS — Q602 Renal agenesis, unspecified: Secondary | ICD-10-CM

## 2017-03-07 DIAGNOSIS — I708 Atherosclerosis of other arteries: Secondary | ICD-10-CM | POA: Diagnosis present

## 2017-03-07 DIAGNOSIS — K922 Gastrointestinal hemorrhage, unspecified: Secondary | ICD-10-CM | POA: Diagnosis present

## 2017-03-07 DIAGNOSIS — N261 Atrophy of kidney (terminal): Secondary | ICD-10-CM | POA: Diagnosis present

## 2017-03-07 DIAGNOSIS — R1011 Right upper quadrant pain: Secondary | ICD-10-CM

## 2017-03-07 DIAGNOSIS — R402413 Glasgow coma scale score 13-15, at hospital admission: Secondary | ICD-10-CM | POA: Diagnosis present

## 2017-03-07 DIAGNOSIS — G8929 Other chronic pain: Secondary | ICD-10-CM | POA: Diagnosis present

## 2017-03-07 DIAGNOSIS — Z7982 Long term (current) use of aspirin: Secondary | ICD-10-CM

## 2017-03-07 DIAGNOSIS — K5731 Diverticulosis of large intestine without perforation or abscess with bleeding: Secondary | ICD-10-CM

## 2017-03-07 DIAGNOSIS — K625 Hemorrhage of anus and rectum: Secondary | ICD-10-CM

## 2017-03-07 DIAGNOSIS — M549 Dorsalgia, unspecified: Secondary | ICD-10-CM | POA: Diagnosis present

## 2017-03-07 HISTORY — DX: Renal agenesis, unspecified: Q60.2

## 2017-03-07 HISTORY — DX: Diverticulosis of large intestine without perforation or abscess with bleeding: K57.31

## 2017-03-07 LAB — CBC
HCT: 35 % — ABNORMAL LOW (ref 39.0–52.0)
Hemoglobin: 11.3 g/dL — ABNORMAL LOW (ref 13.0–17.0)
MCH: 26.4 pg (ref 26.0–34.0)
MCHC: 32.3 g/dL (ref 30.0–36.0)
MCV: 81.8 fL (ref 78.0–100.0)
PLATELETS: 260 10*3/uL (ref 150–400)
RBC: 4.28 MIL/uL (ref 4.22–5.81)
RDW: 15.3 % (ref 11.5–15.5)
WBC: 10.8 10*3/uL — AB (ref 4.0–10.5)

## 2017-03-07 LAB — URINALYSIS, ROUTINE W REFLEX MICROSCOPIC
BILIRUBIN URINE: NEGATIVE
Glucose, UA: NEGATIVE mg/dL
HGB URINE DIPSTICK: NEGATIVE
KETONES UR: NEGATIVE mg/dL
Nitrite: NEGATIVE
PH: 5 (ref 5.0–8.0)
Protein, ur: NEGATIVE mg/dL
Specific Gravity, Urine: 1.029 (ref 1.005–1.030)

## 2017-03-07 LAB — COMPREHENSIVE METABOLIC PANEL
ALK PHOS: 44 U/L (ref 38–126)
ALT: 19 U/L (ref 17–63)
AST: 20 U/L (ref 15–41)
Albumin: 3.3 g/dL — ABNORMAL LOW (ref 3.5–5.0)
Anion gap: 9 (ref 5–15)
BUN: 11 mg/dL (ref 6–20)
CO2: 22 mmol/L (ref 22–32)
CREATININE: 1.18 mg/dL (ref 0.61–1.24)
Calcium: 8.3 mg/dL — ABNORMAL LOW (ref 8.9–10.3)
Chloride: 105 mmol/L (ref 101–111)
GFR calc non Af Amer: >60 mL/min (ref >60)
Glucose, Bld: 101 mg/dL — ABNORMAL HIGH (ref 65–99)
Potassium: 3.9 mmol/L (ref 3.5–5.1)
Sodium: 136 mmol/L (ref 135–145)
Total Bilirubin: 0.6 mg/dL (ref 0.3–1.2)
Total Protein: 6.2 g/dL — ABNORMAL LOW (ref 6.5–8.1)

## 2017-03-07 LAB — TYPE AND SCREEN
ABO/RH(D): O POS
Antibody Screen: NEGATIVE

## 2017-03-07 LAB — POC OCCULT BLOOD, ED: Fecal Occult Bld: POSITIVE — AB

## 2017-03-07 MED ORDER — SODIUM CHLORIDE 0.9 % IV BOLUS (SEPSIS)
1000.0000 mL | Freq: Once | INTRAVENOUS | Status: AC
  Administered 2017-03-07: 1000 mL via INTRAVENOUS

## 2017-03-07 MED ORDER — IOPAMIDOL (ISOVUE-300) INJECTION 61%
INTRAVENOUS | Status: AC
  Administered 2017-03-07: 50 mL
  Filled 2017-03-07: qty 100

## 2017-03-07 MED ORDER — SODIUM CHLORIDE 0.9 % IV BOLUS (SEPSIS)
500.0000 mL | Freq: Once | INTRAVENOUS | Status: AC
  Administered 2017-03-07: 500 mL via INTRAVENOUS

## 2017-03-07 NOTE — ED Provider Notes (Signed)
[facility] EMERGENCY DEPARTMENT Provider Note   CSN: [id number] Arrival date & time: 03/07/17  1845     History   Chief Complaint Chief Complaint  Patient presents with  . Rectal Bleeding    HPI Easton Fetty is a 51 y.o. male.  HPI 51 year old African American male past medical history significant for diverticulitis with diverticular bleeds that has required transfusions in the past presents to the emergency department today for evaluation of rectal bleeding.  Patient states that this morning he started having bright red blood per rectum.  States that he has had 5 episodes since this morning.  States that "it was enough to fill up a 2 L bottle".  Patient states he has a history of same due to a diverticular bleed that has required transfusion and colonoscopy in the past.  Patient was admitted in 2015 for same.  She reports some generalized abdominal cramping.  Denies any associated fever, nausea, vomiting.  Patient has not seen a gastroenterologist since hospital admission.  Patient denies any blood thinner use.  He does report daily Aleve for his chronic back pain.  Patient denies any lightheadedness, dizziness, shortness of breath, palpitations.  He has not taken anything for his pain prior to arrival.  Nothing makes better or worse.  Pt denies any fever, chill, ha, vision changes, lightheadedness, dizziness, congestion, neck pain, cp, sob, cough, urinary symptoms, lower extremity paresthesias.  Past Medical History:  Diagnosis Date  . Diverticulitis 2015   needed blood transfusions  . Renal agenesis     Patient Active Problem List   Diagnosis Date Noted  . Pleuritic chest pain 01/06/2014  . Acute blood loss anemia 01/05/2014  . GI bleed 01/04/2014  . Rectal bleeding 01/04/2014  . GI bleed due to NSAIDs 01/04/2014    Past Surgical History:  Procedure Laterality Date  . BACK SURGERY    . COLONOSCOPY WITH PROPOFOL N/A 01/05/2014   Procedure:  COLONOSCOPY WITH PROPOFOL;  Surgeon: Florencia Reasons, MD;  Location: [facility] ENDOSCOPY;  Service: Endoscopy;  Laterality: N/A;  . ESOPHAGOGASTRODUODENOSCOPY N/A 01/04/2014   Procedure: ESOPHAGOGASTRODUODENOSCOPY (EGD);  Surgeon: Florencia Reasons, MD;  Location: [facility] ENDOSCOPY;  Service: Endoscopy;  Laterality: N/A;  . EYE SURGERY    . FLEXIBLE SIGMOIDOSCOPY N/A 01/04/2014   Procedure: FLEXIBLE SIGMOIDOSCOPY;  Surgeon: Florencia Reasons, MD;  Location: [facility] ENDOSCOPY;  Service: Endoscopy;  Laterality: N/A;  . HAND SURGERY    . urethra recontruction surgery    . URETHRA SURGERY         Home Medications    Prior to Admission medications   Medication Sig Start Date End Date Taking? Authorizing Provider  aspirin 325 MG tablet Take 325 mg by mouth daily.   Yes [provider]  aspirin-acetaminophen-caffeine (EXCEDRIN MIGRAINE) [phone] MG tablet Take 2 tablets by mouth every 6 (six) hours as needed for headache.   Yes [provider]  naproxen sodium (ALEVE) 220 MG tablet Take 220 mg by mouth daily as needed.   Yes [provider]  acetaminophen (TYLENOL) 325 MG tablet Take 2 tablets (650 mg total) by mouth every 6 (six) hours as needed for mild pain (or Fever >/= 101). Patient not taking: Reported on 03/07/2017 01/06/14   Christiane Ha, MD    Family History No family history on file.  Social History Social History   Tobacco Use  . Smoking status: Never Smoker  . Smokeless tobacco: Never Used  Substance Use Topics  . Alcohol use:  Yes    Comment: wine every now and then; rare  . Drug use: No     Allergies   Penicillins   Review of Systems Review of Systems  Constitutional: Negative for chills and fever.  HENT: Negative for congestion and sore throat.   Eyes: Negative for visual disturbance.  Respiratory: Negative for cough and shortness of breath.   Cardiovascular: Negative for chest pain.  Gastrointestinal: Positive for abdominal pain and blood  in stool. Negative for constipation, diarrhea, nausea and vomiting.  Genitourinary: Negative for dysuria, flank pain, frequency, hematuria and urgency.  Musculoskeletal: Negative for arthralgias and myalgias.  Skin: Negative for rash.  Neurological: Negative for dizziness, syncope, weakness, light-headedness, numbness and headaches.  Psychiatric/Behavioral: Negative for sleep disturbance. The patient is not nervous/anxious.      Physical Exam Updated Vital Signs BP (!) 104/54   Pulse 61   Resp 20   Ht 5\' 9"  (1.753 m)   Wt 79.4 kg (175 lb)   SpO2 100%   BMI 25.84 kg/m   Physical Exam  Constitutional: He is oriented to person, place, and time. He appears well-developed and well-nourished.  Non-toxic appearance. No distress.  HENT:  Head: Normocephalic and atraumatic.  Mouth/Throat: Oropharynx is clear and moist.  Eyes: Pupils are equal, round, and reactive to light. Right eye exhibits no discharge. Left eye exhibits no discharge.  Conjunctive are pale  Neck: Normal range of motion. Neck supple.  Cardiovascular: Normal rate, regular rhythm, normal heart sounds and intact distal pulses. Exam reveals no gallop and no friction rub.  No murmur heard. Pulmonary/Chest: Effort normal and breath sounds normal. No stridor. No respiratory distress. He has no wheezes. He has no rales. He exhibits no tenderness.  Abdominal: Soft. Bowel sounds are normal. He exhibits no distension. There is tenderness in the right lower quadrant, suprapubic area and left lower quadrant. There is no rigidity, no rebound, no guarding, no CVA tenderness, no tenderness at McBurney's point and negative Murphy's sign.  Genitourinary:  Genitourinary Comments: Chaperone present for exam. Pt tolerated without difficulty. No external hemorrhoids or fissures noted. No pain with palpation of the rectal vault. No internal hemorrhoids noted. Soft brown stool noted in the rectal vault. Gross hematochezia. No melena. Hemoccult  positive  Musculoskeletal: Normal range of motion. He exhibits no tenderness.  Lymphadenopathy:    He has no cervical adenopathy.  Neurological: He is alert and oriented to person, place, and time.  Skin: Skin is warm and dry. Capillary refill takes less than 2 seconds. No rash noted. There is pallor.  Psychiatric: His behavior is normal. Judgment and thought content normal.  Nursing note and vitals reviewed.    ED Treatments / Results  Labs (all labs ordered are listed, but only abnormal results are displayed) Labs Reviewed  COMPREHENSIVE METABOLIC PANEL - Abnormal; Notable for the following components:      Result Value   Glucose, Bld 101 (*)    Calcium 8.3 (*)    Total Protein 6.2 (*)    Albumin 3.3 (*)    All other components within normal limits  CBC - Abnormal; Notable for the following components:   WBC 10.8 (*)    Hemoglobin 11.3 (*)    HCT 35.0 (*)    All other components within normal limits  POC OCCULT BLOOD, ED - Abnormal; Notable for the following components:   Fecal Occult Bld POSITIVE (*)    All other components within normal limits  POC OCCULT BLOOD, ED  TYPE  AND SCREEN    EKG  EKG Interpretation  Date/Time:  Sunday March 07 2017 19:02:18 EST Ventricular Rate:  78 PR Interval:    QRS Duration: 95 QT Interval:  372 QTC Calculation: 424 R Axis:   14 Text Interpretation:  Sinus rhythm Abnormal R-wave progression, early transition Borderline T abnormalities, inferior leads Minimal ST elevation, anterior leads no significant change since Oct 2015 Confirmed by Pricilla LovelessGoldston, Scott [phone]([id number]) on 03/07/2017 7:39:00 PM       Radiology Ct Abdomen Pelvis W Contrast  Result Date: 03/07/2017 CLINICAL DATA:  Rectal bleeding EXAM: CT ABDOMEN AND PELVIS WITH CONTRAST TECHNIQUE: Multidetector CT imaging of the abdomen and pelvis was performed using the standard protocol following bolus administration of intravenous contrast. CONTRAST:  80 mL ISOVUE-300 IOPAMIDOL  (ISOVUE-300) INJECTION 61% COMPARISON:  September 25, 2008 FINDINGS: Lower chest: There is mild atelectatic change in the posterior right base. Lung bases otherwise are clear. Hepatobiliary: No focal liver lesions are appreciable. Gallbladder wall is not appreciably thickened. There is no biliary duct dilatation. Pancreas: No pancreatic mass or inflammatory focus is evident. Spleen: No splenic lesions are evident. Adrenals/Urinary Tract: Adrenals bilaterally appear normal. Right kidney is extremely atrophic with only a small amount of right renal tissue present, measuring 1.3 x 0.8 cm, stable. Left kidney shows compensatory hypertrophy without mass or hydronephrosis. No renal or ureteral calculus is evident on this study. The urinary bladder wall is diffusely thickened. Stomach/Bowel: There are multiple colonic diverticula without diverticulitis. There is no appreciable bowel wall or mesenteric thickening. There is no evident small bowel obstruction. No free air or portal venous air. Vascular/Lymphatic: There is atherosclerotic calcification in the distal aorta as well as in the common iliac arteries bilaterally. No aneurysm evident. Major mesenteric vessels appear patent. There is no evident adenopathy in the abdomen or pelvis. Reproductive: Prostate and seminal vesicles appear normal in size and contour. No pelvic mass evident. Other: There is no appreciable appendiceal wall thickening or enlargement. A few calcifications, likely small appendicoliths, are present within the appendix. There is no abscess or ascites in the abdomen or pelvis. Musculoskeletal: There is postoperative change in the lower lumbar spine. There are no blastic or lytic bone lesions. There is no intramuscular or abdominal wall lesion. IMPRESSION: 1. Multifocal colonic diverticulosis without frank diverticulitis. No evident bowel obstruction. A cause for the patient's reported recent rectal bleeding has not been established with this study. It may  well be prudent to consider direct visualization of the colon after appropriate bowel preparation. 2.  No appendiceal inflammation.  No abscess. 3. Markedly atrophic right kidney. Left kidney shows compensatory hypertrophy. No renal or ureteral calculus. 4. Diffuse urinary bladder wall thickening, indicative of a degree of cystitis. 5.  There is aortoiliac atherosclerosis. Aortic Atherosclerosis (ICD10-I70.0). Electronically Signed   By: Bretta BangWilliam  Woodruff III M.D.   On: 03/07/2017 21:47    Procedures .Critical Care Performed by: Rise MuLeaphart, Kenneth T, PA-C Authorized by: Rise MuLeaphart, Kenneth T, PA-C   Critical care provider statement:    Critical care time (minutes):  45   Critical care was necessary to treat or prevent imminent or life-threatening deterioration of the following conditions: gi bleed hyotensive.   Critical care was time spent personally by me on the following activities:  Development of treatment plan with patient or surrogate, discussions with consultants, examination of patient, discussions with primary provider, evaluation of patient's response to treatment, ordering and review of laboratory studies, ordering and review of radiographic studies, pulse oximetry, re-evaluation of patient's  condition and review of old charts   I assumed direction of critical care for this patient from another provider in my specialty: no     (including critical care time)  Medications Ordered in ED Medications  sodium chloride 0.9 % bolus 1,000 mL (1,000 mLs Intravenous New Bag/Given 03/07/17 1950)  sodium chloride 0.9 % bolus 1,000 mL (0 mLs Intravenous Stopped 03/07/17 2028)  iopamidol (ISOVUE-300) 61 % injection (50 mLs  Contrast Given 03/07/17 2114)     Initial Impression / Assessment and Plan / ED Course  I have reviewed the triage vital signs and the nursing notes.  Pertinent labs & imaging results that were available during my care of the patient were reviewed by me and considered in my  medical decision making (see chart for details).     Patient presents to the ED with complaints of several episodes of bright red blood per rectum.  Patient with history of diverticular bleed in the past has required admission in blood transfusion.  Patient does not have a regular GI doctor.  Reports some abdominal cramping but denies any associated fever, vomiting, urinary symptoms.  On exam patient is pale.  No tachycardia noted.  Patient is afebrile.  He is mildly hypotensive with systolic blood pressures in the low 90s.  Orthostatics were performed with a significant drop in patient's blood pressure to 63 systolic.  Patient was laid supine and put into trendelenburg.  Patient given 2 L IV fluids.  Blood pressures have improved.  No active bleeding at this time.  She has generalized abdominal tenderness but no focal abdominal pain to palpation.  No signs of peritonitis.  Rectal exam reveals frank hematochezia with clots noted.  Lungs are clear to auscultation bilaterally.  Heart regular rate and rhythm with no rubs murmurs gallops.  The patient is pale appearing.   Lab work reveals no leukocytosis.  Patient's hemoglobin is 11.3 which appears to be elevated from his prior hemoglobins when he was in the hospital before.  BUN and creatinine are normal.  Hemoccult was positive.  CT abdomen was obtained that shows no acute abnormalities.  He does note a diffuse bladder wall thickening consistent with cystitis.  Patient has no urinary complaints.  Have ordered a UA which is pending at this time.  Suspicion for UTI.  Spoke with Dr. Christella Hartigan with [facility].  He recommends hospital admission with trending hemoglobin.  Recommend clear liquid diet.  Feels that patient's bleed will likely spontaneously resolve however will consult in the a.m. on patient and consider colonoscopy at that time.  Did not recommend any antibiotics at this time.  Spoke with Dr. Rema Jasmine with hospital medicine.  Agrees to admission  and will place admission orders.  Patient updated on plan of care.  Patient remains hemodynamically stable.  Blood pressures remain stable.  Patient is not tachycardic.  Denies any abdominal pain at this time.  No signs of peritonitis.  Patient discussed with my attending who was agreeable with the above plan.    Final Clinical Impressions(s) / ED Diagnoses   Final diagnoses:  Rectal bleeding    ED Discharge Orders    None       Rise Mu, PA-C 03/07/17 2225    Rise Mu, PA-C 03/07/17 2355    Pricilla Loveless, MD 03/08/17 [phone]

## 2017-03-07 NOTE — ED Triage Notes (Addendum)
Per [facility]: Pt to ED from home c/o bright-red rectal bleeding with some clots, onset this morning, pt stated that "it was enough to fill a 2-liter bottle." Reports hx diverticulitis and blood transfusion - last one in 2015. Pt states he had some expired Tobasco sauce last week, followed by abd cramping x 2 days, but no symptoms until this morning. Patient A&O x 4, skin warm/dry. Resp e/u. EMS VS: HR 90 NSR, 104/66, 98% RA. Pt denies pain at this time.

## 2017-03-08 ENCOUNTER — Other Ambulatory Visit: Payer: Self-pay

## 2017-03-08 ENCOUNTER — Encounter (HOSPITAL_COMMUNITY): Payer: Self-pay | Admitting: Physician Assistant

## 2017-03-08 DIAGNOSIS — R1011 Right upper quadrant pain: Secondary | ICD-10-CM

## 2017-03-08 DIAGNOSIS — I708 Atherosclerosis of other arteries: Secondary | ICD-10-CM | POA: Diagnosis present

## 2017-03-08 DIAGNOSIS — K625 Hemorrhage of anus and rectum: Secondary | ICD-10-CM

## 2017-03-08 DIAGNOSIS — G8929 Other chronic pain: Secondary | ICD-10-CM | POA: Diagnosis present

## 2017-03-08 DIAGNOSIS — R402413 Glasgow coma scale score 13-15, at hospital admission: Secondary | ICD-10-CM | POA: Diagnosis present

## 2017-03-08 DIAGNOSIS — M549 Dorsalgia, unspecified: Secondary | ICD-10-CM | POA: Diagnosis present

## 2017-03-08 DIAGNOSIS — D62 Acute posthemorrhagic anemia: Secondary | ICD-10-CM | POA: Diagnosis not present

## 2017-03-08 DIAGNOSIS — K5731 Diverticulosis of large intestine without perforation or abscess with bleeding: Secondary | ICD-10-CM | POA: Diagnosis present

## 2017-03-08 DIAGNOSIS — Z880 Allergy status to penicillin: Secondary | ICD-10-CM | POA: Diagnosis not present

## 2017-03-08 DIAGNOSIS — Z7982 Long term (current) use of aspirin: Secondary | ICD-10-CM | POA: Diagnosis not present

## 2017-03-08 DIAGNOSIS — N261 Atrophy of kidney (terminal): Secondary | ICD-10-CM | POA: Diagnosis present

## 2017-03-08 DIAGNOSIS — K922 Gastrointestinal hemorrhage, unspecified: Secondary | ICD-10-CM | POA: Diagnosis not present

## 2017-03-08 DIAGNOSIS — D649 Anemia, unspecified: Secondary | ICD-10-CM | POA: Diagnosis not present

## 2017-03-08 DIAGNOSIS — Q602 Renal agenesis, unspecified: Secondary | ICD-10-CM | POA: Diagnosis not present

## 2017-03-08 LAB — CBC
HCT: 31.6 % — ABNORMAL LOW (ref 39.0–52.0)
HCT: 32.7 % — ABNORMAL LOW (ref 39.0–52.0)
HEMOGLOBIN: 10.5 g/dL — AB (ref 13.0–17.0)
Hemoglobin: 10.1 g/dL — ABNORMAL LOW (ref 13.0–17.0)
MCH: 25.8 pg — ABNORMAL LOW (ref 26.0–34.0)
MCH: 26.1 pg (ref 26.0–34.0)
MCHC: 32 g/dL (ref 30.0–36.0)
MCHC: 32.1 g/dL (ref 30.0–36.0)
MCV: 80.8 fL (ref 78.0–100.0)
MCV: 81.3 fL (ref 78.0–100.0)
PLATELETS: 247 10*3/uL (ref 150–400)
PLATELETS: 263 10*3/uL (ref 150–400)
RBC: 3.91 MIL/uL — ABNORMAL LOW (ref 4.22–5.81)
RBC: 4.02 MIL/uL — ABNORMAL LOW (ref 4.22–5.81)
RDW: 15 % (ref 11.5–15.5)
RDW: 15.1 % (ref 11.5–15.5)
WBC: 9.5 10*3/uL (ref 4.0–10.5)
WBC: 8.4 10*3/uL (ref 4.0–10.5)

## 2017-03-08 LAB — HEMOGLOBIN AND HEMATOCRIT, BLOOD
HEMATOCRIT: 32.6 % — AB (ref 39.0–52.0)
HEMOGLOBIN: 10.6 g/dL — AB (ref 13.0–17.0)

## 2017-03-08 LAB — HIV ANTIBODY (ROUTINE TESTING W REFLEX): HIV Screen 4th Generation wRfx: NONREACTIVE

## 2017-03-08 MED ORDER — SODIUM CHLORIDE 0.9% FLUSH
3.0000 mL | Freq: Two times a day (BID) | INTRAVENOUS | Status: DC
  Administered 2017-03-08 – 2017-03-09 (×3): 3 mL via INTRAVENOUS

## 2017-03-08 NOTE — Progress Notes (Signed)
[facility]   Patient admitted after midnight see H&P for full details.   51 y/o M with hx of diverticulosis, admitted with hematochezia. GI was consulted, may need EGD. No colonoscopy recommended. Will continue to monitor H/H closely and defer to GI if procedures is recommended. No transfusion need at this point.   Latrelle DodrillEdwin Silva, MD

## 2017-03-08 NOTE — Progress Notes (Signed)
Darren Sullivan is a 51 y.o. male patient admitted from ED awake, alert - oriented  X 4 - no acute distress noted.  VSS - Blood pressure 99/60, pulse 63, temperature 98.1 F (36.7 C), temperature source Oral, resp. rate 16, height 5\' 9"  (1.753 m), weight 79.4 kg (175 lb), SpO2 99 %.    IV in place, occlusive dsg intact without redness.   Will cont to eval and treat per MD orders.  Rolland PorterJosephine M Lorieann Argueta, RN 03/08/2017 1:21 AM

## 2017-03-08 NOTE — Care Management Note (Signed)
Case Management Note  Patient Details  Name: Juliann Paresndrew Letarte MRN: [id number] Date of Birth: 12/20/1965  Subjective/Objective:                    Action/Plan:  Patient uninsured , no PCP. Called [facility] no appointments available. [facility], 2017 at 0900 am . Patient aware . Provided contact information.   Will follow for need of MATCH letter.  Patient voiced understanding to all of above. Expected Discharge Date:                  Expected Discharge Plan:  Home/Self Care  In-House Referral:     Discharge planning Services  CM Consult, Indigent Health Clinic  Post Acute Care Choice:    Choice offered to:  Patient  DME Arranged:    DME Agency:     HH Arranged:    HH Agency:     Status of Service:  In process, will continue to follow  If discussed at Long Length of Stay Meetings, dates discussed:    Additional Comments:  Kingsley PlanWile, Dejour Vos Marie, RN 03/08/2017, 12:09 PM

## 2017-03-08 NOTE — H&P (Signed)
History and Physical   Darren Sullivan [id number] DOB: [birth year] DOA: 03/07/2017  PCP: Patient, No Pcp Per  Chief Complaint: Bleeding  HPI:  Is a 51 year old man with history of diverticulosis and lower GI bleed in 2015 presenting with acute onset of hematochezia. He reports having 5 episodes on the day of presentation of bright red blood clots per rectum. Associated symptoms include crampy lower abdominal pain, lightheadedness, dizziness.  He reports having bowel movements every other day, typically fairly hard. He reports about a week ago he ate some old expired hot sauce that cites triggered everything off. He currently is unemployed, formerly worked at a call center in collections. Nonsmoker.  Patient reports this was similar to his prior presentation 2015 where he did require blood transfusion support. He reports seldom alcohol use, none recently.  He denies any shortness of breath, chest pain, syncope. No nausea or vomiting.  ED Course: In emergency department heart rate was not elevated, systolic blood pressure between 90-100, normal respiratory rate, hemoglobin down to 11.3, fecal occult blood positive. Emergency medicine team did rectal exam which reported gross blood clots. GI consult team was called by emergency medicine team who recommended clear liquid diet and they would evaluate the patient in the morning to consider colonoscopy. Of note CT scan revealed colonic diverticulosis, no diverticulitis.  Review of Systems: A complete ROS was obtained; pertinent positives negatives are denoted in the HPI. Otherwise, all systems are negative.   Past Medical History:  Diagnosis Date  . Diverticulitis 2015   needed blood transfusions  . Renal agenesis    Social History   Socioeconomic History  . Marital status: Married    Spouse name: Not on file  . Number of children: Not on file  . Years of education: Not on file  . Highest education level: Not on file  Social Needs  .  Financial resource strain: Not on file  . Food insecurity - worry: Not on file  . Food insecurity - inability: Not on file  . Transportation needs - medical: Not on file  . Transportation needs - non-medical: Not on file  Occupational History  . Not on file  Tobacco Use  . Smoking status: Never Smoker  . Smokeless tobacco: Never Used  Substance and Sexual Activity  . Alcohol use: Yes    Comment: wine every now and then; rare  . Drug use: No  . Sexual activity: Not on file  Other Topics Concern  . Not on file  Social History Narrative  . Not on file   Family hx: -Father died at age 51 in a MVA; mother is alive at 51 years old and has had a brain aneurysm  Physical Exam: Vitals:   03/07/17 2130 03/07/17 2200 03/07/17 2230 03/08/17 0107  BP: 113/62 107/65 108/64 99/60  Pulse: 64 60 65 63  Resp: 18 16 14 16   Temp:    98.1 F (36.7 C)  TempSrc:    Oral  SpO2: 100% 99% 100% 99%  Weight:    79.4 kg (175 lb)  Height:    5\' 9"  (1.753 m)   General: Appears calm and comfortable black man. ENT: Grossly normal hearing, MMM. Cardiovascular: RRR. No M/R/G. No LE edema.  Respiratory: CTA bilaterally. No wheezes or crackles. Normal respiratory effort. Abdomen: Soft, tender to palpation bilateral lower quadrants, no guarding or rebound. Bowel sounds present.  Skin: No rash or induration seen on limited exam. Musculoskeletal: Grossly normal tone BUE/BLE. Appropriate ROM.  Psychiatric: Grossly normal  mood and affect. Neurologic: Moves all extremities in coordinated fashion.  I have personally reviewed the following labs, culture data, and imaging studies.  Assessment/Plan:  Acute lower GI bleed Patient's clinical picture of hematochezia in presence of prior episode of diverticulosis suggests diverticular bleed recurrence; however, other etiologies not excluded (malignancy, inflammatory, etc).   Plan: -continue clear liquid diet, GI team to evaluate for possible colonoscopy  -CBC q 8   -I discussed the benefits / burdens of blood transfusion with the patient, consented verbally for blood -Transfuse prbcs to achieve hgb goal of 7  DVT prophylaxis: SCDs Code Status: Full Disposition Plan: Anticipate D/C home in 1-2 d Consults called: GI team consulted by emergency medicine team Admission status: admit to hospital medicine   Laurell RoofPatrick Deirdre Gryder, MD [facility] Page:[phone]  If 7PM-7AM, please contact night-coverage [email or web address] Password [email or web address]

## 2017-03-08 NOTE — Consult Note (Addendum)
[facility] Consult: 10:10 AM 03/08/2017  LOS: 0 days    Referring Provider: Dr Edward Jolly  Primary Care Physician:  Patient, No Pcp Per Primary Gastroenterologist:  unassigned    Reason for Consultation:  Painless hematochezia   HPI: Darren Sullivan is a 51 y.o. male.   Has no health care PMD or health care insurance.  PMH  Spinal DJD, previous surgery.  Renal agenesis.  Hx diverticular bleed and blood loss anemia in 2015.  Transfused 1 U PRBC for Hgb 7.9.      2015 EGD normal.   2015 Flex Sig:  Sigmoid tics.  Fresh and older blood.   2015 Colonoscopy: for hematochezia. Dr Clent Ridges.  Scattered diverticulae.  No active bleeding or old blood.  No polyps  4 days of RUQ pain and malaise, body aches but no URI, cough sxs.  This began last week after he ate some food on which she had sprinkled some 40-year-old Tabasco sauce.  RUQ pain has abated but still lingers.   Came to [facility] ED yesterday with painless hematochezia.  Had about four episodes from the AM thru 6 PM, large volume with some clots.  No stool or bleeding since last PM.  Vomited bilious material x 1 yesterday. No additional abd pain.  Hgb 11.3 >> 10.5.  MCV normal.  LFTs and BMET ok.   CT ab/pelvis, not angiography:  Diverticulosis.  Atrophic right kidney.  Aortoiliac atherosclerosis.   Liver, GB and pancreas unremarkable.    Pt takes aleve ~ 2 x weekly.  Smokes pot for back pain ~ 2 x month.  No etoh.  No reflux.  No dysphagia.      Past Medical History:  Diagnosis Date  . Diverticulosis of colon with hemorrhage 2015   needed blood transfusion x 1 for Hgb of 7.9.    . Renal agenesis     Past Surgical History:  Procedure Laterality Date  . BACK SURGERY    . COLONOSCOPY WITH PROPOFOL N/A 01/05/2014   Procedure: COLONOSCOPY WITH PROPOFOL;  Surgeon:  Florencia Reasons, MD;  Location: [facility] ENDOSCOPY;  Service: Endoscopy;  Laterality: N/A;  . ESOPHAGOGASTRODUODENOSCOPY N/A 01/04/2014   Procedure: ESOPHAGOGASTRODUODENOSCOPY (EGD);  Surgeon: Florencia Reasons, MD;  Location: [facility] ENDOSCOPY;  Service: Endoscopy;  Laterality: N/A;  . EYE SURGERY    . FLEXIBLE SIGMOIDOSCOPY N/A 01/04/2014   Procedure: FLEXIBLE SIGMOIDOSCOPY;  Surgeon: Florencia Reasons, MD;  Location: [facility] ENDOSCOPY;  Service: Endoscopy;  Laterality: N/A;  . HAND SURGERY    . urethra recontruction surgery    . URETHRA SURGERY      Prior to Admission medications   Medication Sig Start Date End Date Taking? Authorizing Provider  aspirin 325 MG tablet Take 325 mg by mouth daily.   Yes [provider]  aspirin-acetaminophen-caffeine (EXCEDRIN MIGRAINE) [phone] MG tablet Take 2 tablets by mouth every 6 (six) hours as needed for headache.   Yes [provider]  naproxen sodium (ALEVE) 220 MG tablet Take 220 mg by mouth daily as needed.   Yes [provider]  acetaminophen (TYLENOL) 325 MG tablet Take 2 tablets (650 mg total) by mouth every 6 (six) hours as needed for mild pain (or Fever >/= 101). Patient not taking: Reported on 03/07/2017 01/06/14   Christiane HaSullivan, Corinna L, MD    Scheduled Meds: . sodium chloride flush  3 mL Intravenous Q12H    Allergies as of 03/07/2017 - Review Complete 03/07/2017  Allergen Reaction Noted  . Penicillins Hives 10/21/2012    FHx non contributory    REVIEW OF SYSTEMS: Constitutional: General, the patient has good energy levels with the exception of the last week. ENT:  No nose bleeds Pulm: No trouble breathing.  No cough. CV:  No palpitations, no LE edema.  No chest pain GU:  No hematuria, no frequency GI:  Per hpi Heme: No excessive bleeding or bruising. Transfusions:  See hpi Neuro:  No headaches, no peripheral tingling or numbness Derm:  No itching, no rash or sores.  Endocrine:  No sweats or chills.  No polyuria  or dysuria Immunization: Does not take flu shots. Travel:  None beyond local counties in last few months.   Social Hx  Married No tobacco or drugs  PHYSICAL EXAM: Vital signs in last 24 hours: Vitals:   03/08/17 0107 03/08/17 0700  BP: 99/60 108/63  Pulse: 63 64  Resp: 16   Temp: 98.1 F (36.7 C) 98.4 F (36.9 C)  SpO2: 99% 98%   Wt Readings from Last 3 Encounters:  03/08/17 79.4 kg (175 lb)  01/05/14 73.5 kg (162 lb 1.6 oz)  11/14/12 79.4 kg (175 lb)    General: Pleasant, looks well.  Comfortable. Head: No facial asymmetry or swelling.  No signs of head trauma. Eyes: No scleral icterus. Ears: Not hard of hearing. Nose: No congestion discharge Mouth: Good dentition.  Tongue midline.  Oral mucosa moist and clear. Neck: No JVD, no masses, no thyromegaly. Lungs: Clear bilaterally.  No labored breathing Heart: S1, S2 present Abdomen: Soft.  Bowel sounds.  Not distended.  Minor right quadrant tenderness..   Rectal: Deferred rectal exam.  This was performed yesterday no masses, hemorrhoids, fissures.  There was soft brown stool in vault along with fresh blood. Musc/Skeltl: No joint redness, swelling or obvious Extremities: No CCE. Neurologic: Alert.  Oriented x3.  Some 4 limbs, limb strength.  No tremors, no obvious deficits. Skin: No rashes, no sores, no suspicious lesions, no telangiectasia. Nodes: No cervical or inguinal adenopathy. Psych: Calm, pleasant, cooperative.   LAB RESULTS: Recent Labs    03/07/17 1859 03/08/17 0224 03/08/17 0838  WBC 10.8* 9.5 8.4  HGB 11.3* 10.1* 10.5*  HCT 35.0* 31.6* 32.7*  PLT 260 247 263   BMET Lab Results  Component Value Date   NA 136 03/07/2017   NA 143 01/03/2014   NA 138 09/25/2008   K 3.9 03/07/2017   K 4.3 01/03/2014   K 4.1 09/25/2008   CL 105 03/07/2017   CL 107 01/03/2014   CL 105 09/25/2008   CO2 22 03/07/2017   CO2 21 01/03/2014   CO2 22 04/16/2007   GLUCOSE 101 (H) 03/07/2017   GLUCOSE 136 (H)  01/03/2014   GLUCOSE 82 09/25/2008   BUN 11 03/07/2017   BUN 20 01/03/2014   BUN 13 09/25/2008   CREATININE 1.18 03/07/2017   CREATININE 1.20 01/03/2014   CREATININE 1.2 09/25/2008   CALCIUM 8.3 (L) 03/07/2017   CALCIUM 8.7 01/03/2014   CALCIUM 9.1 04/16/2007   LFT Recent Labs    03/07/17 1859  PROT  6.2*  ALBUMIN 3.3*  AST 20  ALT 19  ALKPHOS 44  BILITOT 0.6   RADIOLOGY STUDIES: Ct Abdomen Pelvis W Contrast  Result Date: 03/07/2017 CLINICAL DATA:  Rectal bleeding EXAM: CT ABDOMEN AND PELVIS WITH CONTRAST TECHNIQUE: Multidetector CT imaging of the abdomen and pelvis was performed using the standard protocol following bolus administration of intravenous contrast. CONTRAST:  80 mL ISOVUE-300 IOPAMIDOL (ISOVUE-300) INJECTION 61% COMPARISON:  September 25, 2008 FINDINGS: Lower chest: There is mild atelectatic change in the posterior right base. Lung bases otherwise are clear. Hepatobiliary: No focal liver lesions are appreciable. Gallbladder wall is not appreciably thickened. There is no biliary duct dilatation. Pancreas: No pancreatic mass or inflammatory focus is evident. Spleen: No splenic lesions are evident. Adrenals/Urinary Tract: Adrenals bilaterally appear normal. Right kidney is extremely atrophic with only a small amount of right renal tissue present, measuring 1.3 x 0.8 cm, stable. Left kidney shows compensatory hypertrophy without mass or hydronephrosis. No renal or ureteral calculus is evident on this study. The urinary bladder wall is diffusely thickened. Stomach/Bowel: There are multiple colonic diverticula without diverticulitis. There is no appreciable bowel wall or mesenteric thickening. There is no evident small bowel obstruction. No free air or portal venous air. Vascular/Lymphatic: There is atherosclerotic calcification in the distal aorta as well as in the common iliac arteries bilaterally. No aneurysm evident. Major mesenteric vessels appear patent. There is no evident  adenopathy in the abdomen or pelvis. Reproductive: Prostate and seminal vesicles appear normal in size and contour. No pelvic mass evident. Other: There is no appreciable appendiceal wall thickening or enlargement. A few calcifications, likely small appendicoliths, are present within the appendix. There is no abscess or ascites in the abdomen or pelvis. Musculoskeletal: There is postoperative change in the lower lumbar spine. There are no blastic or lytic bone lesions. There is no intramuscular or abdominal wall lesion. IMPRESSION: 1. Multifocal colonic diverticulosis without frank diverticulitis. No evident bowel obstruction. A cause for the patient's reported recent rectal bleeding has not been established with this study. It may well be prudent to consider direct visualization of the colon after appropriate bowel preparation. 2.  No appendiceal inflammation.  No abscess. 3. Markedly atrophic right kidney. Left kidney shows compensatory hypertrophy. No renal or ureteral calculus. 4. Diffuse urinary bladder wall thickening, indicative of a degree of cystitis. 5.  There is aortoiliac atherosclerosis. Aortic Atherosclerosis (ICD10-I70.0). Electronically Signed   By: Bretta BangWilliam  Woodruff III M.D.   On: 03/07/2017 21:47     IMPRESSION:   *   Hematochezia.  Suspect recurrent diverticular bleed.  *   Right upper quadrant pain, although the location is non-pelvic, patient has CT findings of diffuse urinary bladder thickening, suggests chronic cystitis.  Bilateral renal atrophy, right >> left.  Urinalysis is benign.    *   Blood loss anemia.  Noncritical.  Does not need transfusion at this point.  *   Lack of healthcare coverage.   PLAN:     *   Dr. Leone PayorGessner is to follow patient.  I do not think he needs colonoscopy at this point, the bleeding has ceased and he has no family history of colon cancer and had no polyps of the 2015 colonoscopy.  ? EGD for eval of RUQ pain.    *  Leave on clears for now.  CBC in  AM.    *  May need referral to a urologist?     Jennye MoccasinSarah Gribbin  03/08/2017, 10:10 AM Pager: [phone]  [facility] Attending   I have taken an interval history, reviewed the chart and examined the patient. I agree with the Advanced Practitioner's note, impression and recommendations.  Also:  He is clear RUQ pain onset after using old hot sauce and feels that is much better. He is hungry He is tender some over RUQ ribs and abd. Mild and benign I think.  Between clinical scenario, CT scan and 2015 colonoscopy think self-limited recurrent diverticular bleed.  Will hold off on repeat colonoscopy but if he has another event that could be needed.  Start soft diet Home tomorrow unless new problems  Iva Boop, MD, [facility] [phone] (pager) 03/08/2017 4:17 PM

## 2017-03-09 DIAGNOSIS — K5731 Diverticulosis of large intestine without perforation or abscess with bleeding: Secondary | ICD-10-CM

## 2017-03-09 DIAGNOSIS — D649 Anemia, unspecified: Secondary | ICD-10-CM

## 2017-03-09 LAB — CBC
HCT: 33 % — ABNORMAL LOW (ref 39.0–52.0)
HEMOGLOBIN: 10.6 g/dL — AB (ref 13.0–17.0)
MCH: 25.9 pg — ABNORMAL LOW (ref 26.0–34.0)
MCHC: 32.1 g/dL (ref 30.0–36.0)
MCV: 80.7 fL (ref 78.0–100.0)
Platelets: 272 10*3/uL (ref 150–400)
RBC: 4.09 MIL/uL — AB (ref 4.22–5.81)
RDW: 14.7 % (ref 11.5–15.5)
WBC: 8.7 10*3/uL (ref 4.0–10.5)

## 2017-03-09 LAB — IRON AND TIBC
Iron: 78 ug/dL (ref 45–182)
SATURATION RATIOS: 23 % (ref 17.9–39.5)
TIBC: 346 ug/dL (ref 250–450)
UIBC: 268 ug/dL

## 2017-03-09 LAB — FERRITIN: Ferritin: 122 ng/mL (ref 24–336)

## 2017-03-09 LAB — RETICULOCYTES
RBC.: 4.25 MIL/uL (ref 4.22–5.81)
Retic Count, Absolute: 38.3 10*3/uL (ref 19.0–186.0)
Retic Ct Pct: 0.9 % (ref 0.4–3.1)

## 2017-03-09 LAB — FOLATE: FOLATE: 12 ng/mL (ref >5.9)

## 2017-03-09 LAB — VITAMIN B12: VITAMIN B 12: 355 pg/mL (ref 180–914)

## 2017-03-09 NOTE — Progress Notes (Signed)
          Daily Rounding Note  03/09/2017, 9:59 AM  LOS: 1 day   SUBJECTIVE:   Chief complaint: none    No stools, no passing of fresh or old blood.  Feels well.  Not dizzy.    OBJECTIVE:         Vital signs in last 24 hours:    Temp:  [98 F (36.7 C)-98.5 F (36.9 C)] 98 F (36.7 C) (12/18 0508) Pulse Rate:  [57-73] 57 (12/18 0508) Resp:  [18] 18 (12/18 0508) BP: (112-119)/(60-73) 112/65 (12/18 0508) SpO2:  [97 %-100 %] 97 % (12/18 0508) Last BM Date: 03/07/17(as to pt.) Filed Weights   03/07/17 1848 03/08/17 0107  Weight: 79.4 kg (175 lb) 79.4 kg (175 lb)   General: looks great   Heart: RRR Chest: clear Abdomen: soft, NT, ND.  Active BS  Extremities: no CCE Neuro/Psych:  Oriented x 3.  Fully alert and cooperative.   Lab Results: Recent Labs    03/08/17 0224 03/08/17 0838 03/08/17 1707 03/09/17 0523  WBC 9.5 8.4  --  8.7  HGB 10.1* 10.5* 10.6* 10.6*  HCT 31.6* 32.7* 32.6* 33.0*  PLT 247 263  --  272    ASSESMENT:   *  Hematochezia.  Diverticular bleed suspected, hx same in 2015.   Stopped  *  Normocytic anemia.  Hgb stable since initial 1 gram drop.     PLAN   *  Home today.    *  Note hospital MD ordered anemia labs on pt.  These getting drawn now.  *  Not setting up fup GI appt.  Advised pt if in 1 week he still has the RUQ discomfort, to purchase a box of OTC omeprazole and take this until finished (2 to 4 weeks), if it helps keep taking it, if it does not help, he can call GI to be seen in office.  Pt has no health care coverage so he is  wanting to avoid unnecessary MD visits.      Jennye MoccasinSarah Gribbin  03/09/2017, 9:59 AM Pager: [phone]  Iva Booparl E. Zethan Alfieri, MD, [facility]

## 2017-03-09 NOTE — Discharge Summary (Signed)
Physician Discharge Summary  Darren Sullivan  BJY:[id number]  DOB: 03-27-1965  DOA: 03/07/2017 PCP: Patient, No Pcp Per  Admit date: 03/07/2017 Discharge date: 03/09/2017  Admitted From: Home  Disposition:  Home   Recommendations for Outpatient Follow-up:  1. Follow up with PCP in 1-2 weeks 2. Please obtain BMP/CBC in one week to monitor Cr and Hgb   Discharge Condition: Stable  CODE STATUS: Full code  Diet recommendation: Heart Healthy   Brief/Interim Summary: For full details see H&P/progress notes but in brief, Darren Sullivan is a 51 y/o M with medical Hx of diverticulosis, congenital kidney malformation and hx lower GI bleed present to the ED c/o RLQ pain and rectal bleeding. Upon ED evaluation Hbg found to lower from baseline and FOBT positive. Patient was admitted for GI bleed evaluation. CT on the abdomen revealed diverticulosis w/o diverticulitis. GI was consulted and did not recommended any procedures as this time as patient had a recent colonoscopy, felt to be diverticular bleed. Bleeding spontaneously resolved and hgb remained stable. Patient clinically improved and was discharge home to follow up with PCP   Subjective: Patient seen and examined, have no complaints today, no more bloody stools. No acute events overnight.   Discharge Diagnoses/Hospital Course:  Acute blood loss anemia  Felt to be due to diverticular bleed  GI was consulted no colonoscopy indicated  Anemia panel - negative - normal iron  Follow up with PCP   Diverticulosis  Stable   On the day of the discharge the patient's vitals were stable, and no other acute medical condition were reported by patient. the patient was felt safe to be discharge to home   Discharge Instructions  You were cared for by a hospitalist during your hospital stay. If you have any questions about your discharge medications or the care you received while you were in the hospital after you are discharged, you can call the unit  and asked to speak with the hospitalist on call if the hospitalist that took care of you is not available. Once you are discharged, your primary care physician will handle any further medical issues. Please note that NO REFILLS for any discharge medications will be authorized once you are discharged, as it is imperative that you return to your primary care physician (or establish a relationship with a primary care physician if you do not have one) for your aftercare needs so that they can reassess your need for medications and monitor your lab values.  Discharge Instructions    Activity as tolerated - No restrictions   Complete by:  As directed    Call MD for:  difficulty breathing, headache or visual disturbances   Complete by:  As directed    Call MD for:  extreme fatigue   Complete by:  As directed    Call MD for:  hives   Complete by:  As directed    Call MD for:  persistant dizziness or light-headedness   Complete by:  As directed    Call MD for:  persistant nausea and vomiting   Complete by:  As directed    Call MD for:  redness, tenderness, or signs of infection (pain, swelling, redness, odor or green/yellow discharge around incision site)   Complete by:  As directed    Call MD for:  severe uncontrolled pain   Complete by:  As directed    Call MD for:  temperature >100.4   Complete by:  As directed    Diet - low sodium  heart healthy   Complete by:  As directed      Allergies as of 03/09/2017      Reactions   Penicillins Hives   Has patient had a PCN reaction causing immediate rash, facial/tongue/throat swelling, SOB or lightheadedness with hypotension: No Has patient had a PCN reaction causing severe rash involving mucus membranes or skin necrosis: No Has patient had a PCN reaction that required hospitalization: No Has patient had a PCN reaction occurring within the last 10 years: No If all of the above answers are "NO", then may proceed with Cephalosporin use.       Medication List    STOP taking these medications   aspirin-acetaminophen-caffeine 250-250-65 MG tablet Commonly known as:  EXCEDRIN MIGRAINE   naproxen sodium 220 MG tablet Commonly known as:  ALEVE     TAKE these medications   acetaminophen 325 MG tablet Commonly known as:  TYLENOL Take 2 tablets (650 mg total) by mouth every 6 (six) hours as needed for mild pain (or Fever >/= 101).   aspirin 325 MG tablet Take 325 mg by mouth daily.      Follow-up Information    [facility] Follow up.   Specialty:  Family Medicine Why:  Hospital follow up appointment March 24, 2017 at 0900 am  Contact information: [address] [place] [address] [phone]       Iva Boop, MD Follow up.   Specialty:  Gastroenterology Why:  call if needed for any stomach or intestinal issues.   Contact information: [address] [place] [address] [phone]          Allergies  Allergen Reactions  . Penicillins Hives    Has patient had a PCN reaction causing immediate rash, facial/tongue/throat swelling, SOB or lightheadedness with hypotension: No Has patient had a PCN reaction causing severe rash involving mucus membranes or skin necrosis: No Has patient had a PCN reaction that required hospitalization: No Has patient had a PCN reaction occurring within the last 10 years: No If all of the above answers are "NO", then may proceed with Cephalosporin use.    Consultations:  GI bleed    Procedures/Studies: Ct Abdomen Pelvis W Contrast  Result Date: 03/07/2017 CLINICAL DATA:  Rectal bleeding EXAM: CT ABDOMEN AND PELVIS WITH CONTRAST TECHNIQUE: Multidetector CT imaging of the abdomen and pelvis was performed using the standard protocol following bolus administration of intravenous contrast. CONTRAST:  80 mL ISOVUE-300 IOPAMIDOL (ISOVUE-300) INJECTION 61% COMPARISON:  September 25, 2008 FINDINGS: Lower chest: There is mild  atelectatic change in the posterior right base. Lung bases otherwise are clear. Hepatobiliary: No focal liver lesions are appreciable. Gallbladder wall is not appreciably thickened. There is no biliary duct dilatation. Pancreas: No pancreatic mass or inflammatory focus is evident. Spleen: No splenic lesions are evident. Adrenals/Urinary Tract: Adrenals bilaterally appear normal. Right kidney is extremely atrophic with only a small amount of right renal tissue present, measuring 1.3 x 0.8 cm, stable. Left kidney shows compensatory hypertrophy without mass or hydronephrosis. No renal or ureteral calculus is evident on this study. The urinary bladder wall is diffusely thickened. Stomach/Bowel: There are multiple colonic diverticula without diverticulitis. There is no appreciable bowel wall or mesenteric thickening. There is no evident small bowel obstruction. No free air or portal venous air. Vascular/Lymphatic: There is atherosclerotic calcification in the distal aorta as well as in the common iliac arteries bilaterally. No aneurysm evident. Major mesenteric vessels appear patent. There is no  evident adenopathy in the abdomen or pelvis. Reproductive: Prostate and seminal vesicles appear normal in size and contour. No pelvic mass evident. Other: There is no appreciable appendiceal wall thickening or enlargement. A few calcifications, likely small appendicoliths, are present within the appendix. There is no abscess or ascites in the abdomen or pelvis. Musculoskeletal: There is postoperative change in the lower lumbar spine. There are no blastic or lytic bone lesions. There is no intramuscular or abdominal wall lesion. IMPRESSION: 1. Multifocal colonic diverticulosis without frank diverticulitis. No evident bowel obstruction. A cause for the patient's reported recent rectal bleeding has not been established with this study. It may well be prudent to consider direct visualization of the colon after appropriate bowel  preparation. 2.  No appendiceal inflammation.  No abscess. 3. Markedly atrophic right kidney. Left kidney shows compensatory hypertrophy. No renal or ureteral calculus. 4. Diffuse urinary bladder wall thickening, indicative of a degree of cystitis. 5.  There is aortoiliac atherosclerosis. Aortic Atherosclerosis (ICD10-I70.0). Electronically Signed   By: Bretta BangWilliam  Woodruff III M.D.   On: 03/07/2017 21:47    Discharge Exam: Vitals:   03/08/17 2132 03/09/17 0508  BP: 119/60 112/65  Pulse: 65 (!) 57  Resp: 18 18  Temp: 98.5 F (36.9 C) 98 F (36.7 C)  SpO2: 99% 97%   Vitals:   03/08/17 0700 03/08/17 1439 03/08/17 2132 03/09/17 0508  BP: 108/63 119/73 119/60 112/65  Pulse: 64 73 65 (!) 57  Resp:  18 18 18   Temp: 98.4 F (36.9 C) 98.4 F (36.9 C) 98.5 F (36.9 C) 98 F (36.7 C)  TempSrc: Oral Oral Oral Oral  SpO2: 98% 100% 99% 97%  Weight:      Height:        General: Pt is alert, awake, not in acute distress Cardiovascular: RRR, S1/S2 +, no rubs, no gallops Respiratory: CTA bilaterally, no wheezing, no rhonchi Abdominal: Soft, NT, ND, bowel sounds + Extremities: no edema, no cyanosis  The results of significant diagnostics from this hospitalization (including imaging, microbiology, ancillary and laboratory) are listed below for reference.     Microbiology: No results found for this or any previous visit (from the past 240 hour(s)).   Labs: BNP (last 3 results) No results for input(s): BNP in the last 8760 hours. Basic Metabolic Panel: Recent Labs  Lab 03/07/17 1859  NA 136  K 3.9  CL 105  CO2 22  GLUCOSE 101*  BUN 11  CREATININE 1.18  CALCIUM 8.3*   Liver Function Tests: Recent Labs  Lab 03/07/17 1859  AST 20  ALT 19  ALKPHOS 44  BILITOT 0.6  PROT 6.2*  ALBUMIN 3.3*   No results for input(s): LIPASE, AMYLASE in the last 168 hours. No results for input(s): AMMONIA in the last 168 hours. CBC: Recent Labs  Lab 03/07/17 1859 03/08/17 0224  03/08/17 0838 03/08/17 1707 03/09/17 0523  WBC 10.8* 9.5 8.4  --  8.7  HGB 11.3* 10.1* 10.5* 10.6* 10.6*  HCT 35.0* 31.6* 32.7* 32.6* 33.0*  MCV 81.8 80.8 81.3  --  80.7  PLT 260 247 263  --  272   Cardiac Enzymes: No results for input(s): CKTOTAL, CKMB, CKMBINDEX, TROPONINI in the last 168 hours. BNP: Invalid input(s): POCBNP CBG: No results for input(s): GLUCAP in the last 168 hours. D-Dimer No results for input(s): DDIMER in the last 72 hours. Hgb A1c No results for input(s): HGBA1C in the last 72 hours. Lipid Profile No results for input(s): CHOL, HDL, LDLCALC, TRIG, CHOLHDL,  LDLDIRECT in the last 72 hours. Thyroid function studies No results for input(s): TSH, T4TOTAL, T3FREE, THYROIDAB in the last 72 hours.  Invalid input(s): FREET3 Anemia work up Recent Labs    03/09/17 1008  VITAMINB12 355  FOLATE 12.0  FERRITIN 122  TIBC 346  IRON 78  RETICCTPCT 0.9   Urinalysis    Component Value Date/Time   COLORURINE STRAW (A) 03/07/2017 2305   APPEARANCEUR CLEAR 03/07/2017 2305   LABSPEC 1.029 03/07/2017 2305   PHURINE 5.0 03/07/2017 2305   GLUCOSEU NEGATIVE 03/07/2017 2305   HGBUR NEGATIVE 03/07/2017 2305   BILIRUBINUR NEGATIVE 03/07/2017 2305   KETONESUR NEGATIVE 03/07/2017 2305   PROTEINUR NEGATIVE 03/07/2017 2305   UROBILINOGEN 0.2 04/16/2007 1820   NITRITE NEGATIVE 03/07/2017 2305   LEUKOCYTESUR SMALL (A) 03/07/2017 2305   Sepsis Labs Invalid input(s): PROCALCITONIN,  WBC,  LACTICIDVEN Microbiology No results found for this or any previous visit (from the past 240 hour(s)).   Time coordinating discharge: 32 minutes  SIGNED:  Latrelle DodrillEdwin Silva, MD  [facility] 03/09/2017, 12:26 PM  Pager please text page via  [email or web address] Password [email or web address]

## 2017-03-09 NOTE — Progress Notes (Signed)
Pt discharged to home with wife.  Discharge instructions given.

## 2017-03-24 ENCOUNTER — Inpatient Hospital Stay (INDEPENDENT_AMBULATORY_CARE_PROVIDER_SITE_OTHER): Payer: Self-pay | Admitting: Physician Assistant

## 2017-10-04 ENCOUNTER — Ambulatory Visit: Payer: Self-pay | Admitting: Internal Medicine

## 2017-10-29 ENCOUNTER — Ambulatory Visit: Payer: Self-pay | Attending: Family Medicine | Admitting: Family Medicine

## 2017-10-29 ENCOUNTER — Encounter: Payer: Self-pay | Admitting: Family Medicine

## 2017-10-29 VITALS — BP 122/78 | HR 59 | Temp 98.1°F | Ht 70.0 in | Wt 176.4 lb

## 2017-10-29 DIAGNOSIS — N529 Male erectile dysfunction, unspecified: Secondary | ICD-10-CM | POA: Insufficient documentation

## 2017-10-29 DIAGNOSIS — Z880 Allergy status to penicillin: Secondary | ICD-10-CM | POA: Insufficient documentation

## 2017-10-29 DIAGNOSIS — N5233 Erectile dysfunction following urethral surgery: Secondary | ICD-10-CM

## 2017-10-29 MED ORDER — TADALAFIL 20 MG PO TABS: 20.0000 mg | ORAL_TABLET | Freq: Every day | ORAL | 3 refills | Status: DC | PRN
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
## 2017-10-29 NOTE — Patient Instructions (Addendum)
Erectile Dysfunction Erectile dysfunction (ED) is the inability to get or keep an erection in order to have sexual intercourse. Erectile dysfunction may include:  Inability to get an erection.  Lack of enough hardness of the erection to allow penetration.  Loss of the erection before sex is finished. What are the causes? This condition may be caused by:  Certain medicines, such as:  Pain relievers.  Antihistamines.  Antidepressants.  Blood pressure medicines.  Water pills (diuretics).  Ulcer medicines.  Muscle relaxants.  Drugs.  Excessive drinking.  Psychological causes, such as:  Anxiety.  Depression.  Sadness.  Exhaustion.  Performance fear.  Stress.  Physical causes, such as:  Artery problems. This may include diabetes, smoking, liver disease, or atherosclerosis.  High blood pressure.  Hormonal problems, such as low testosterone.  Obesity.  Nerve problems. This may include back or pelvic injuries, diabetes mellitus, multiple sclerosis, or Parkinson disease. What are the signs or symptoms? Symptoms of this condition include:  Inability to get an erection.  Lack of enough hardness of the erection to allow penetration.  Loss of the erection before sex is finished.  Normal erections at some times, but with frequent unsatisfactory episodes.  Low sexual satisfaction in either partner due to erection problems.  A curved penis occurring with erection. The curve may cause pain or the penis may be too curved to allow for intercourse.  Never having nighttime erections. How is this diagnosed? This condition is often diagnosed by:  Performing a physical exam to find other diseases or specific problems with the penis.  Asking you detailed questions about the problem.  Performing blood tests to check for diabetes mellitus or to measure hormone levels.  Performing other tests to check for underlying health conditions.  Performing an ultrasound  exam to check for scarring.  Performing a test to check blood flow to the penis.  Doing a sleep study at home to measure nighttime erections. How is this treated? This condition may be treated by:  Medicine taken by mouth to help you achieve an erection (oral medicine).  Hormone replacement therapy to replace low testosterone levels.  Medicine that is injected into the penis. Your health care provider may instruct you how to give yourself these injections at home.  Vacuum pump. This is a pump with a ring on it. The pump and ring are placed on the penis and used to create pressure that helps the penis become erect.  Penile implant surgery. In this procedure, you may receive:  An inflatable implant. This consists of cylinders, a pump, and a reservoir. The cylinders can be inflated with a fluid that helps to create an erection, and they can be deflated after intercourse.  A semi-rigid implant. This consists of two silicone rubber rods. The rods provide some rigidity. They are also flexible, so the penis can both curve downward in its normal position and become straight for sexual intercourse.  Blood vessel surgery, to improve blood flow to the penis. During this procedure, a blood vessel from a different part of the body is placed into the penis to allow blood to flow around (bypass) damaged or blocked blood vessels.  Lifestyle changes, such as exercising more, losing weight, and quitting smoking. Follow these instructions at home: Medicines   Take over-the-counter and prescription medicines only as told by your health care provider. Do not increase the dosage without first discussing it with your health care provider.  If you are using self-injections, perform injections as directed by your   as directed by your health care provider. Make sure to avoid any veins that are on the surface of the penis. After giving an injection, apply pressure to the injection site for 5 minutes. General  instructions  Exercise regularly, as directed by your health care provider. Work with your health care provider to lose weight, if needed.  Do not use any products that contain nicotine or tobacco, such as cigarettes and e-cigarettes. If you need help quitting, ask your health care provider.  Before using a vacuum pump, read the instructions that come with the pump and discuss any questions with your health care provider.  Keep all follow-up visits as told by your health care provider. This is important. Contact a health care provider if:  You feel nauseous.  You vomit. Get help right away if:  You are taking oral or injectable medicines and you have an erection that lasts longer than 4 hours. If your health care provider is unavailable, go to the nearest emergency room for evaluation. An erection that lasts much longer than 4 hours can result in permanent damage to your penis.  You have severe pain in your groin or abdomen.  You develop redness or severe swelling of your penis.  You have redness spreading up into your groin or lower abdomen.  You are unable to urinate.  You experience chest pain or a rapid heart beat (palpitations) after taking oral medicines. Summary  Erectile dysfunction (ED) is the inability to get or keep an erection during sexual intercourse. This problem can usually be treated successfully.  This condition is diagnosed based on a physical exam, your symptoms, and tests to determine the cause. Treatment varies depending on the cause, and may include medicines, hormone therapy, surgery, or vacuum pump.  You may need follow-up visits to make sure that you are using your medicines or devices correctly.  Get help right away if you are taking or injecting medicines and you have an erection that lasts longer than 4 hours. This information is not intended to replace advice given to you by your health care provider. Make sure you discuss any questions you have with  your health care provider. Document Released: 03/06/2000 Document Revised: 03/25/2016 Document Reviewed: 03/25/2016 Elsevier Interactive Patient Education  2017 [employer]  Erectile Dysfunction Erectile dysfunction (ED) is the inability to get or keep an erection in order to have sexual intercourse. Erectile dysfunction may include:  Inability to get an erection.  Lack of enough hardness of the erection to allow penetration.  Loss of the erection before sex is finished.  What are the causes? This condition may be caused by:  Certain medicines, such as: ? Pain relievers. ? Antihistamines. ? Antidepressants. ? Blood pressure medicines. ? Water pills (diuretics). ? Ulcer medicines. ? Muscle relaxants. ? Drugs.  Excessive drinking.  Psychological causes, such as: ? Anxiety. ? Depression. ? Sadness. ? Exhaustion. ? Performance fear. ? Stress.  Physical causes, such as: ? Artery problems. This may include diabetes, smoking, liver disease, or atherosclerosis. ? High blood pressure. ? Hormonal problems, such as low testosterone. ? Obesity. ? Nerve problems. This may include back or pelvic injuries, diabetes mellitus, multiple sclerosis, or Parkinson disease.  What are the signs or symptoms? Symptoms of this condition include:  Inability to get an erection.  Lack of enough hardness of the erection to allow penetration.  Loss of the erection before sex is finished.  Normal erections at some times, but with frequent unsatisfactory episodes.  Low sexual  satisfaction in either partner due to erection problems.  A curved penis occurring with erection. The curve may cause pain or the penis may be too curved to allow for intercourse.  Never having nighttime erections.  How is this diagnosed? This condition is often diagnosed by:  Performing a physical exam to find other diseases or specific problems with the penis.  Asking you detailed questions about the  problem.  Performing blood tests to check for diabetes mellitus or to measure hormone levels.  Performing other tests to check for underlying health conditions.  Performing an ultrasound exam to check for scarring.  Performing a test to check blood flow to the penis.  Doing a sleep study at home to measure nighttime erections.  How is this treated? This condition may be treated by:  Medicine taken by mouth to help you achieve an erection (oral medicine).  Hormone replacement therapy to replace low testosterone levels.  Medicine that is injected into the penis. Your health care provider may instruct you how to give yourself these injections at home.  Vacuum pump. This is a pump with a ring on it. The pump and ring are placed on the penis and used to create pressure that helps the penis become erect.  Penile implant surgery. In this procedure, you may receive: ? An inflatable implant. This consists of cylinders, a pump, and a reservoir. The cylinders can be inflated with a fluid that helps to create an erection, and they can be deflated after intercourse. ? A semi-rigid implant. This consists of two silicone rubber rods. The rods provide some rigidity. They are also flexible, so the penis can both curve downward in its normal position and become straight for sexual intercourse.  Blood vessel surgery, to improve blood flow to the penis. During this procedure, a blood vessel from a different part of the body is placed into the penis to allow blood to flow around (bypass) damaged or blocked blood vessels.  Lifestyle changes, such as exercising more, losing weight, and quitting smoking.  Follow these instructions at home: Medicines  Take over-the-counter and prescription medicines only as told by your health care provider. Do not increase the dosage without first discussing it with your health care provider.  If you are using self-injections, perform injections as directed by your  health care provider. Make sure to avoid any veins that are on the surface of the penis. After giving an injection, apply pressure to the injection site for 5 minutes. General instructions  Exercise regularly, as directed by your health care provider. Work with your health care provider to lose weight, if needed.  Do not use any products that contain nicotine or tobacco, such as cigarettes and e-cigarettes. If you need help quitting, ask your health care provider.  Before using a vacuum pump, read the instructions that come with the pump and discuss any questions with your health care provider.  Keep all follow-up visits as told by your health care provider. This is important. Contact a health care provider if:  You feel nauseous.  You vomit. Get help right away if:  You are taking oral or injectable medicines and you have an erection that lasts longer than 4 hours. If your health care provider is unavailable, go to the nearest emergency room for evaluation. An erection that lasts much longer than 4 hours can result in permanent damage to your penis.  You have severe pain in your groin or abdomen.  You develop redness or severe swelling of  your penis.  You have redness spreading up into your groin or lower abdomen.  You are unable to urinate.  You experience chest pain or a rapid heart beat (palpitations) after taking oral medicines. Summary  Erectile dysfunction (ED) is the inability to get or keep an erection during sexual intercourse. This problem can usually be treated successfully.  This condition is diagnosed based on a physical exam, your symptoms, and tests to determine the cause. Treatment varies depending on the cause, and may include medicines, hormone therapy, surgery, or vacuum pump.  You may need follow-up visits to make sure that you are using your medicines or devices correctly.  Get help right away if you are taking or injecting medicines and you have an erection  that lasts longer than 4 hours. This information is not intended to replace advice given to you by your health care provider. Make sure you discuss any questions you have with your health care provider. Document Released: 03/06/2000 Document Revised: 03/25/2016 Document Reviewed: 03/25/2016 Elsevier Interactive Patient Education  2017 [employer].

## 2018-02-28 ENCOUNTER — Ambulatory Visit: Payer: Medicaid Other | Admitting: Family Medicine

## 2018-09-15 ENCOUNTER — Other Ambulatory Visit: Payer: Self-pay

## 2018-09-15 ENCOUNTER — Emergency Department (HOSPITAL_COMMUNITY): Payer: PRIVATE HEALTH INSURANCE

## 2018-09-15 ENCOUNTER — Emergency Department (HOSPITAL_COMMUNITY)
Admission: EM | Admit: 2018-09-15 | Discharge: 2018-09-15 | Disposition: A | Discharge status: Home / Self Care | Payer: PRIVATE HEALTH INSURANCE | Attending: Emergency Medicine | Admitting: Emergency Medicine

## 2018-09-15 ENCOUNTER — Encounter (HOSPITAL_COMMUNITY): Payer: Self-pay

## 2018-09-15 DIAGNOSIS — K5900 Constipation, unspecified: Secondary | ICD-10-CM | POA: Insufficient documentation

## 2018-09-15 DIAGNOSIS — N390 Urinary tract infection, site not specified: Secondary | ICD-10-CM | POA: Diagnosis not present

## 2018-09-15 DIAGNOSIS — K5730 Diverticulosis of large intestine without perforation or abscess without bleeding: Secondary | ICD-10-CM | POA: Diagnosis not present

## 2018-09-15 DIAGNOSIS — Z79899 Other long term (current) drug therapy: Secondary | ICD-10-CM | POA: Insufficient documentation

## 2018-09-15 DIAGNOSIS — R109 Unspecified abdominal pain: Secondary | ICD-10-CM | POA: Diagnosis present

## 2018-09-15 LAB — COMPREHENSIVE METABOLIC PANEL
ALT: 36 U/L (ref 0–44)
AST: 28 U/L (ref 15–41)
Albumin: 4.5 g/dL (ref 3.5–5.0)
Alkaline Phosphatase: 61 U/L (ref 38–126)
Anion gap: 6 (ref 5–15)
BUN: 14 mg/dL (ref 6–20)
CO2: 24 mmol/L (ref 22–32)
Calcium: 9 mg/dL (ref 8.9–10.3)
Chloride: 106 mmol/L (ref 98–111)
Creatinine, Ser: 1.14 mg/dL (ref 0.61–1.24)
GFR calc Af Amer: >60 mL/min (ref >60)
GFR calc non Af Amer: >60 mL/min (ref >60)
Glucose, Bld: 90 mg/dL (ref 70–99)
Potassium: 4 mmol/L (ref 3.5–5.1)
Sodium: 136 mmol/L (ref 135–145)
Total Bilirubin: 1 mg/dL (ref 0.3–1.2)
Total Protein: 7.7 g/dL (ref 6.5–8.1)

## 2018-09-15 LAB — URINALYSIS, ROUTINE W REFLEX MICROSCOPIC
Bilirubin Urine: NEGATIVE
Glucose, UA: NEGATIVE mg/dL
Ketones, ur: NEGATIVE mg/dL
Nitrite: POSITIVE — AB
Protein, ur: NEGATIVE mg/dL
Specific Gravity, Urine: 1.019 (ref 1.005–1.030)
pH: 5 (ref 5.0–8.0)

## 2018-09-15 LAB — CBC WITH DIFFERENTIAL/PLATELET
Abs Immature Granulocytes: 0.02 10*3/uL (ref 0.00–0.07)
Basophils Absolute: 0.1 10*3/uL (ref 0.0–0.1)
Basophils Relative: 1 %
Eosinophils Absolute: 0.1 10*3/uL (ref 0.0–0.5)
Eosinophils Relative: 2 %
HCT: 45.7 % (ref 39.0–52.0)
Hemoglobin: 14.6 g/dL (ref 13.0–17.0)
Immature Granulocytes: 0 %
Lymphocytes Relative: 31 %
Lymphs Abs: 2 10*3/uL (ref 0.7–4.0)
MCH: 27 pg (ref 26.0–34.0)
MCHC: 31.9 g/dL (ref 30.0–36.0)
MCV: 84.5 fL (ref 80.0–100.0)
Monocytes Absolute: 0.5 10*3/uL (ref 0.1–1.0)
Monocytes Relative: 8 %
Neutro Abs: 3.8 10*3/uL (ref 1.7–7.7)
Neutrophils Relative %: 58 %
Platelets: 221 10*3/uL (ref 150–400)
RBC: 5.41 MIL/uL (ref 4.22–5.81)
RDW: 14 % (ref 11.5–15.5)
WBC: 6.5 10*3/uL (ref 4.0–10.5)
nRBC: 0 % (ref 0.0–0.2)

## 2018-09-15 LAB — LIPASE, BLOOD: Lipase: 34 U/L (ref 11–51)

## 2018-09-15 MED ORDER — SODIUM CHLORIDE 0.9 % IV BOLUS
1000.0000 mL | Freq: Once | INTRAVENOUS | Status: AC
  Administered 2018-09-15: 12:00:00 1000 mL via INTRAVENOUS

## 2018-09-15 MED ORDER — SULFAMETHOXAZOLE-TRIMETHOPRIM 800-160 MG PO TABS: 1.0000 | ORAL_TABLET | Freq: Two times a day (BID) | ORAL | 0 refills | Status: AC

## 2018-09-15 MED ORDER — IOHEXOL 300 MG/ML  SOLN
100.0000 mL | Freq: Once | INTRAMUSCULAR | Status: AC | PRN
  Administered 2018-09-15: 100 mL via INTRAVENOUS

## 2018-09-15 MED ORDER — SODIUM CHLORIDE 0.9% FLUSH: 3.0000 mL | Freq: Once | INTRAVENOUS | Status: DC

## 2018-09-15 NOTE — ED Notes (Signed)
Discharge instructions reviewed with patient. Patient verbalizes understanding. VSS.   

## 2018-09-15 NOTE — ED Provider Notes (Signed)
[facility] Note   CSN: [id number] Arrival date & time: 09/15/18  [id number]    History   Chief Complaint Chief Complaint  Patient presents with   Abdominal Pain    HPI Darren Sullivan is a 53 y.o. male with history of diverticulosis, anemia, renal agenesis presenting today for abdominal pain, constipation and bloating for the past 1 month.  Patient reports gradual onset of symptoms, reports he was seen by [facility] on 09/06/2018 for this problem at that time they performed plain film of the abdomen and told him he had a small blockage in his colon, and gave him tramadol for pain, no other medications.  Patient reports that he has been drinking increased water since that time but has had persistent symptoms.  Reports small bowel movement every 3-4 days without pain or bleeding, last bowel movement this morning small amount.  Above information obtained directly from patient, unable to access [facility] records within our EMR system.  Denies fever/chills, cough/shortness of breath, chest pain, nausea/vomiting, diarrhea, blood in stool, dysuria/hematuria, back pain, saddle paresthesias, urinary retention, incontinence or any additional concerns.    HPI  Past Medical History:  Diagnosis Date   Diverticulosis of colon with hemorrhage 2015   needed blood transfusion x 1 for Hgb of 7.9.     Renal agenesis     Patient Active Problem List   Diagnosis Date Noted   Diverticulosis of colon with hemorrhage    RUQ pain    Lower GI bleed 03/07/2017   Pleuritic chest pain 01/06/2014   Acute blood loss anemia 01/05/2014   GI bleed 01/04/2014   Rectal bleeding 01/04/2014   GI bleed due to NSAIDs 01/04/2014    Past Surgical History:  Procedure Laterality Date   BACK SURGERY     COLONOSCOPY WITH PROPOFOL N/A 01/05/2014   Procedure: COLONOSCOPY WITH PROPOFOL;  Surgeon: Florencia Reasonsobert Buccini V, MD;  [facility];   Service: Endoscopy;  Laterality: N/A;   ESOPHAGOGASTRODUODENOSCOPY N/A 01/04/2014   Procedure: ESOPHAGOGASTRODUODENOSCOPY (EGD);  Surgeon: Florencia Reasonsobert Buccini V, MD;  [facility];  Service: Endoscopy;  Laterality: N/A;   EYE SURGERY     FLEXIBLE SIGMOIDOSCOPY N/A 01/04/2014   Procedure: FLEXIBLE SIGMOIDOSCOPY;  Surgeon: Florencia Reasonsobert Buccini V, MD;  [facility];  Service: Endoscopy;  Laterality: N/A;   HAND SURGERY     urethra recontruction surgery     URETHRA SURGERY          Home Medications    Prior to Admission medications   Medication Sig Start Date End Date Taking? Authorizing Provider  naproxen sodium (ALEVE) 220 MG tablet Take 220 mg by mouth daily as needed (pain).   Yes [provider]  tadalafil (CIALIS) 20 MG tablet Take 1 tablet (20 mg total) by mouth daily as needed for erectile dysfunction. 10/29/17  Yes Fulp, Cammie, MD  traMADol (ULTRAM) 50 MG tablet Take 50 mg by mouth 4 (four) times daily as needed for moderate pain.  09/06/18  Yes [provider]  acetaminophen (TYLENOL) 325 MG tablet Take 2 tablets (650 mg total) by mouth every 6 (six) hours as needed for mild pain (or Fever >/= 101). Patient not taking: Reported on 03/07/2017 01/06/14   Christiane HaSullivan, Corinna L, MD  sulfamethoxazole-trimethoprim (BACTRIM DS) 800-160 MG tablet Take 1 tablet by mouth 2 (two) times daily for 7 days. 09/15/18 09/22/18  Bill SalinasMorelli, Lorena Benham A, PA-C    Family History No family history on file.  Social History Social  History   Tobacco Use   Smoking status: Never Smoker   Smokeless tobacco: Never Used  Substance Use Topics   Alcohol use: Yes    Comment: wine every now and then; rare   Drug use: No     Allergies   Penicillins   Review of Systems Review of Systems  Constitutional: Negative.  Negative for chills and fever.  Gastrointestinal: Positive for abdominal distention, abdominal pain and constipation. Negative for blood in stool, diarrhea,  nausea and vomiting.  Genitourinary: Negative.  Negative for dysuria, hematuria, scrotal swelling and testicular pain.  All other systems reviewed and are negative.  Physical Exam Updated Vital Signs BP 124/88    Pulse 65    Temp 98.8 F (37.1 C) (Oral)    Resp 16    Ht 5\' 10"  (1.778 m)    Wt 79.4 kg    SpO2 100%    BMI 25.11 kg/m   Physical Exam Constitutional:      General: He is not in acute distress.    Appearance: Normal appearance. He is well-developed. He is not ill-appearing or diaphoretic.  HENT:     Head: Normocephalic and atraumatic.     Right Ear: External ear normal.     Left Ear: External ear normal.     Nose: Nose normal.  Eyes:     General: Vision grossly intact. Gaze aligned appropriately.     Pupils: Pupils are equal, round, and reactive to light.  Neck:     Musculoskeletal: Normal range of motion.     Trachea: Phonation normal. No tracheal deviation.  Pulmonary:     Effort: Pulmonary effort is normal. No accessory muscle usage or respiratory distress.     Breath sounds: Normal breath sounds and air entry.  Abdominal:     General: Bowel sounds are normal. There is no distension.     Palpations: Abdomen is soft.     Tenderness: There is abdominal tenderness in the left upper quadrant and left lower quadrant. There is no guarding or rebound.  Musculoskeletal: Normal range of motion.  Skin:    General: Skin is warm and dry.  Neurological:     Mental Status: He is alert.     GCS: GCS eye subscore is 4. GCS verbal subscore is 5. GCS motor subscore is 6.     Comments: Speech is clear and goal oriented, follows commands Major Cranial nerves without deficit, no facial droop Moves extremities without ataxia, coordination intact  Psychiatric:        Behavior: Behavior normal.    ED Treatments / Results  Labs (all labs ordered are listed, but only abnormal results are displayed) Labs Reviewed  URINALYSIS, ROUTINE W REFLEX MICROSCOPIC - Abnormal; Notable for the  following components:      Result Value   Hgb urine dipstick SMALL (*)    Nitrite POSITIVE (*)    Leukocytes,Ua SMALL (*)    Bacteria, UA RARE (*)    All other components within normal limits  URINE CULTURE  LIPASE, BLOOD  COMPREHENSIVE METABOLIC PANEL  CBC WITH DIFFERENTIAL/PLATELET    EKG None  Radiology Ct Abdomen Pelvis W Contrast  Result Date: 09/15/2018 CLINICAL DATA:  Abdominal pain EXAM: CT ABDOMEN AND PELVIS WITH CONTRAST TECHNIQUE: Multidetector CT imaging of the abdomen and pelvis was performed using the standard protocol following bolus administration of intravenous contrast. CONTRAST:  100mL OMNIPAQUE IOHEXOL 300 MG/ML  SOLN COMPARISON:  March 07, 2017 FINDINGS: Lower chest: There is slight bibasilar lung  atelectatic change. No lung base edema or consolidation. Hepatobiliary: There is hepatic steatosis. No focal liver lesions are appreciable. The gallbladder wall is not appreciably thickened. There is no biliary duct dilatation. Pancreas: There is no pancreatic mass or inflammatory focus. Spleen: No splenic lesions are evident. Adrenals/Urinary Tract: Adrenals bilaterally appear normal. In the right pararenal fossa region, there is a stable small focus of tissue measuring 1.5 x 1.2 cm, likely secondary to advanced renal atrophy. No recognizable renal tissue is noted in the right renal fossa region. The left kidney shows evidence of compensatory hypertrophy. There is no left renal mass or hydronephrosis. There is no evident left renal or ureteral calculus. Urinary bladder is midline with wall thickness within normal limits. Stomach/Bowel: There is no appreciable bowel wall or mesenteric thickening. There are scattered colonic diverticula without diverticulitis. There is no evident bowel obstruction. Terminal ileum appears unremarkable. There is no evident free air or portal venous air. Vascular/Lymphatic: There is aortic and iliac artery atherosclerosis. No aneurysm evident. There  is no adenopathy in the abdomen or pelvis. Reproductive: Prostate and seminal vesicles appear normal in size and contour. No evident pelvic mass. Other: Appendix appears normal. There is no abscess or ascites in the abdomen or pelvis. Musculoskeletal: There is postoperative change in the lower lumbar region. No blastic or lytic bone lesions are evident. There is no intramuscular or appreciable abdominal wall lesion. IMPRESSION: 1. Scattered colonic diverticula without diverticulitis. No bowel obstruction. No abscess in the abdomen or pelvis. Appendix appears normal. 2. No recognizable renal tissue on the right. A small soft tissue focus is noted in the right pararenal fossa which may represent residua of a markedly atrophic right kidney. There is compensatory hypertrophy of the left kidney. Left kidney appears normal. No left hydronephrosis. No left-sided renal or ureteral calculus. Urinary bladder wall thickness normal. 3.  There is aortic and iliac artery atherosclerosis. 4.  Hepatic steatosis. Electronically Signed   By: Lowella Grip III M.D.   On: 09/15/2018 12:17    Procedures Procedures (including critical care time)  Medications Ordered in ED Medications  sodium chloride 0.9 % bolus 1,000 mL (1,000 mLs Intravenous Bolus 09/15/18 1138)  iohexol (OMNIPAQUE) 300 MG/ML solution 100 mL (100 mLs Intravenous Contrast Given 09/15/18 1141)     Initial Impression / Assessment and Plan / ED Course  I have reviewed the triage vital signs and the nursing notes.  Pertinent labs & imaging results that were available during my care of the patient were reviewed by me and considered in my medical decision making (see chart for details).    Initial examination overall well-appearing 53 year old male in no acute distress.  He has been having approximately 4 weeks of abdominal pain, intermittent constipation.  No blood in the stool or nausea/vomiting or urinary symptoms.  Heart regular rate and rhythm  without murmur, lungs clear to auscultation bilaterally, abdomen soft with moderate left-sided tenderness to palpation, no distention or peritoneal signs, extremities neurovascular intact.  GU examination deferred by patient.  As patient with history of diverticulosis and left abdominal pain will obtain CT to evaluate for infection. - CBC within normal limits CMP within normal limits Lipase within normal limits Urinalysis with nitrite positive, leukocyte positive, rare bacteria; concerning for urinary tract infection although patient is without symptoms.   CT Abd/Pelvis: IMPRESSION: 1. Scattered colonic diverticula without diverticulitis. No bowel obstruction. No abscess in the abdomen or pelvis. Appendix appears normal. 2. No recognizable renal tissue on the right. A small soft  tissue focus is noted in the right pararenal fossa which may represent residua of a markedly atrophic right kidney. There is compensatory hypertrophy of the left kidney. Left kidney appears normal. No left hydronephrosis. No left-sided renal or ureteral calculus. Urinary bladder wall thickness normal. 3.  There is aortic and iliac artery atherosclerosis. 4.  Hepatic steatosis.  ---------------- Discussed case with Dr. Juleen ChinaKohut, we will treat patient's urinary tract infection today with Bactrim, (hives to PCN) no dosage adjustment needed with CrCl per Cockcroft-Gault, encourage PCP and GI follow-up.  Urine sent for culture.  Patient reevaluated resting comfortably and in no acute distress.  Reports that he has had a urinary tract infection many years ago but none since.  Reexamination of the abdomen reveals soft, nondistended and without peritoneal signs.  He is tolerating p.o. without difficulty no nausea or vomiting.  He states understanding of diagnoses today and of care plan and is agreeable for discharge.  As to patient's constipation that has been intermittent I have encouraged him to increase his water intake and use OTC  MiraLAX as instructed on the bottle to help with this and to call his GI specialist for follow-up.  At this time there does not appear to be any evidence of an acute emergency medical condition and the patient appears stable for discharge with appropriate outpatient follow up. Diagnosis was discussed with patient who verbalizes understanding of care plan and is agreeable to discharge. I have discussed return precautions with patient who verbalizes understanding of return precautions. Patient encouraged to follow-up with their PCP. All questions answered.  Patient has been discharged in good condition.  Patient's case discussed with Dr. Juleen ChinaKohut who agrees with plan to discharge with bactrim and outpatient follow-up.   Note: Portions of this report may have been transcribed using voice recognition software. Every effort was made to ensure accuracy; however, inadvertent computerized transcription errors may still be present. Final Clinical Impressions(s) / ED Diagnoses   Final diagnoses:  Lower urinary tract infectious disease  Constipation, unspecified constipation type  Diverticulosis of colon    ED Discharge Orders         Ordered    sulfamethoxazole-trimethoprim (BACTRIM DS) 800-160 MG tablet  2 times daily     09/15/18 1438           Elizabeth PalauMorelli, Rogelio Winbush A, PA-C 09/15/18 1445    Raeford RazorKohut, Stephen, MD 09/18/18 1111

## 2018-09-15 NOTE — ED Notes (Signed)
PA Brandon at bedside. 

## 2018-09-15 NOTE — ED Triage Notes (Signed)
Pt states for 3 weeks he has had abd pain. Pt states he was seen at [facility] walk-in last week, where he was told he had a blockage of his colon.  Pt states he has had less BMs than normal. Denies N/V.

## 2018-09-15 NOTE — Discharge Instructions (Addendum)
You have been diagnosed today with urinary tract infection, constipation, diverticulosis.  At this time there does not appear to be the presence of an emergent medical condition, however there is always the potential for conditions to change. Please read and follow the below instructions.  Please return to the Emergency Department immediately for any new or worsening symptoms or if your symptoms do not improve within 3 days. Please be sure to follow up with your Primary Care Provider within one week regarding your visit today; please call their office to schedule an appointment even if you are feeling better for a follow-up visit. It appears that you have a urinary tract infection today, your urine has been sent for culture which will result in 2-3 days, you will be contacted if it grows out bacteria not treated by the antibiotic prescribed to you today.  Please take the antibiotic Bactrim as prescribed to treat your urinary tract infection. Please treat your constipation by increasing your water intake and using over-the-counter MiraLAX as directed on the packaging.  Call your primary care provider and your GI specialist at [facility] today to schedule a follow-up appointment.  Get help right away if: You have very bad back pain. You have very bad pain in your lower belly. You have a fever. You are sick to your stomach (nauseous). You are throwing up. Get help right away if: You have a fever, and your symptoms suddenly get worse. You leak poop or have blood in your poop. Your belly feels hard or bigger than normal (is bloated). You have very bad belly pain. You feel dizzy or you faint. Get help right away if: Your pain gets worse. Your bloating becomes very bad. You have a fever or chills, and your symptoms suddenly get worse. You vomit. You have bowel movements that are bloody or black. You have bleeding from your rectum. Any new/concerning or worsening symptoms  Please read the  additional information packets attached to your discharge summary.  Do not take your medicine if  develop an itchy rash, swelling in your mouth or lips, or difficulty breathing; call 911 and seek immediate emergency medical attention if this occurs.

## 2018-09-17 LAB — URINE CULTURE: Culture: >=100000 — AB

## 2018-09-18 ENCOUNTER — Telehealth: Payer: Self-pay | Admitting: Emergency Medicine

## 2018-09-18 NOTE — Telephone Encounter (Signed)
Post ED Visit - Positive Culture Follow-up  Culture report reviewed by antimicrobial stewardship pharmacist: [place] Team []  Nathan Batchelder, Pharm.D. []  [address], Pharm.D., BCPS AQ-ID []  Heide Guile, Pharm.D., BCPS []  Parks Neptune, Pharm.D., BCPS []  [place], Pharm.D., BCPS, AAHIVP []  [place], Pharm.D., BCPS, AAHIVP []  Legrand Como, PharmD, BCPS []  Salome Arnt, PharmD, BCPS []  Johnnette Gourd, PharmD, BCPS []  Hughes Better, PharmD []  Leeroy Cha, PharmD, BCPS []  Laqueta Linden, PharmD  [place] Team []  [address], PharmD []  Leodis Sias, PharmD []  Lindell Spar, PharmD []  Royetta Asal, Rph []  Graylin Shiver) Rema Fendt, PharmD []  Glennon Mac, PharmD []  Arlyn Dunning, PharmD [x]  Netta Cedars, PharmD []  Dia Sitter, PharmD []  Leone Haven, PharmD []  Gretta Arab, PharmD []  Theodis Shove, PharmD []  Peggyann Juba, PharmD   Positive urine culture Treated with Sulfamethoxazole-Trimethoprim, organism sensitive to the same and no further patient follow-up is required at this time.  Darren Sullivan 09/18/2018, 1:02 PM

## 2019-05-05 ENCOUNTER — Ambulatory Visit: Payer: PRIVATE HEALTH INSURANCE | Attending: Internal Medicine

## 2019-05-05 ENCOUNTER — Ambulatory Visit: Payer: PRIVATE HEALTH INSURANCE

## 2019-05-05 DIAGNOSIS — Z23 Encounter for immunization: Secondary | ICD-10-CM | POA: Insufficient documentation

## 2019-05-05 NOTE — Progress Notes (Signed)
   Covid-19 Vaccination Clinic  Name:  Darren Sullivan    MRN: [id number] DOB: [birth year]  05/05/2019  [name] was observed post Covid-19 immunization for 15 minutes without incidence. He was provided with Vaccine Information Sheet and instruction to access the V-Safe system.   [name] was instructed to call 911 with any severe reactions post vaccine: Marland Kitchen Difficulty breathing  . Swelling of your face and throat  . A fast heartbeat  . A bad rash all over your body  . Dizziness and weakness    Immunizations Administered    Name Date Dose VIS Date Route   Pfizer COVID-19 Vaccine 05/05/2019  3:21 PM 0.3 mL 03/03/2019 Intramuscular   Manufacturer: [employer], [employer]   Lot: [id number]   NDC: [id number]

## 2019-05-29 ENCOUNTER — Ambulatory Visit: Payer: PRIVATE HEALTH INSURANCE | Attending: Internal Medicine

## 2019-05-29 ENCOUNTER — Ambulatory Visit: Payer: PRIVATE HEALTH INSURANCE

## 2019-05-29 DIAGNOSIS — Z23 Encounter for immunization: Secondary | ICD-10-CM | POA: Insufficient documentation

## 2019-05-29 NOTE — Progress Notes (Signed)
   Covid-19 Vaccination Clinic  Name:  Darren Sullivan    MRN: [id number] DOB: 18-Nov-1965  05/29/2019  [name] was observed post Covid-19 immunization for 15 minutes without incident. He was provided with Vaccine Information Sheet and instruction to access the V-Safe system.   [name] was instructed to call 911 with any severe reactions post vaccine: Marland Kitchen Difficulty breathing  . Swelling of face and throat  . A fast heartbeat  . A bad rash all over body  . Dizziness and weakness   Immunizations Administered    Name Date Dose VIS Date Route   Pfizer COVID-19 Vaccine 05/29/2019  3:18 PM 0.3 mL 03/03/2019 Intramuscular   Manufacturer: [employer], [employer]   Lot: [id number]   NDC: [address]-7

## 2021-02-27 ENCOUNTER — Encounter (HOSPITAL_COMMUNITY): Payer: Self-pay | Admitting: Emergency Medicine

## 2021-02-27 ENCOUNTER — Observation Stay (HOSPITAL_COMMUNITY)
Admission: EM | Admit: 2021-02-27 | Discharge: 2021-03-01 | Disposition: A | Discharge status: Home / Self Care | Payer: Self-pay | Attending: Internal Medicine | Admitting: Internal Medicine

## 2021-02-27 ENCOUNTER — Emergency Department (HOSPITAL_COMMUNITY): Payer: Self-pay

## 2021-02-27 ENCOUNTER — Other Ambulatory Visit: Payer: Self-pay

## 2021-02-27 ENCOUNTER — Ambulatory Visit (HOSPITAL_COMMUNITY)
Admission: EM | Admit: 2021-02-27 | Discharge: 2021-02-27 | Disposition: A | Discharge status: Home / Self Care | Payer: PRIVATE HEALTH INSURANCE | Attending: Family Medicine | Admitting: Family Medicine

## 2021-02-27 ENCOUNTER — Encounter (HOSPITAL_COMMUNITY): Payer: Self-pay | Admitting: *Deleted

## 2021-02-27 DIAGNOSIS — K625 Hemorrhage of anus and rectum: Secondary | ICD-10-CM

## 2021-02-27 DIAGNOSIS — Z79899 Other long term (current) drug therapy: Secondary | ICD-10-CM | POA: Insufficient documentation

## 2021-02-27 DIAGNOSIS — Z20822 Contact with and (suspected) exposure to covid-19: Secondary | ICD-10-CM | POA: Insufficient documentation

## 2021-02-27 DIAGNOSIS — D62 Acute posthemorrhagic anemia: Secondary | ICD-10-CM | POA: Diagnosis present

## 2021-02-27 DIAGNOSIS — K922 Gastrointestinal hemorrhage, unspecified: Principal | ICD-10-CM | POA: Diagnosis present

## 2021-02-27 DIAGNOSIS — R1032 Left lower quadrant pain: Secondary | ICD-10-CM

## 2021-02-27 LAB — COMPREHENSIVE METABOLIC PANEL
ALT: 19 U/L (ref 0–44)
AST: 16 U/L (ref 15–41)
Albumin: 3.8 g/dL (ref 3.5–5.0)
Alkaline Phosphatase: 41 U/L (ref 38–126)
Anion gap: 9 (ref 5–15)
BUN: 8 mg/dL (ref 6–20)
CO2: 23 mmol/L (ref 22–32)
Calcium: 9.1 mg/dL (ref 8.9–10.3)
Chloride: 105 mmol/L (ref 98–111)
Creatinine, Ser: 1.19 mg/dL (ref 0.61–1.24)
GFR, Estimated: >60 mL/min (ref >60)
Glucose, Bld: 99 mg/dL (ref 70–99)
Potassium: 4.3 mmol/L (ref 3.5–5.1)
Sodium: 137 mmol/L (ref 135–145)
Total Bilirubin: 1.7 mg/dL — ABNORMAL HIGH (ref 0.3–1.2)
Total Protein: 6.6 g/dL (ref 6.5–8.1)

## 2021-02-27 LAB — CBC WITH DIFFERENTIAL/PLATELET
Abs Immature Granulocytes: 0.04 10*3/uL (ref 0.00–0.07)
Basophils Absolute: 0.1 10*3/uL (ref 0.0–0.1)
Basophils Relative: 0 %
Eosinophils Absolute: 0.3 10*3/uL (ref 0.0–0.5)
Eosinophils Relative: 3 %
HCT: 30.3 % — ABNORMAL LOW (ref 39.0–52.0)
Hemoglobin: 9.2 g/dL — ABNORMAL LOW (ref 13.0–17.0)
Immature Granulocytes: 0 %
Lymphocytes Relative: 32 %
Lymphs Abs: 3.6 10*3/uL (ref 0.7–4.0)
MCH: 26.4 pg (ref 26.0–34.0)
MCHC: 30.4 g/dL (ref 30.0–36.0)
MCV: 86.8 fL (ref 80.0–100.0)
Monocytes Absolute: 1.1 10*3/uL — ABNORMAL HIGH (ref 0.1–1.0)
Monocytes Relative: 9 %
Neutro Abs: 6.4 10*3/uL (ref 1.7–7.7)
Neutrophils Relative %: 56 %
Platelets: 195 10*3/uL (ref 150–400)
RBC: 3.49 MIL/uL — ABNORMAL LOW (ref 4.22–5.81)
RDW: 15.2 % (ref 11.5–15.5)
WBC: 11.4 10*3/uL — ABNORMAL HIGH (ref 4.0–10.5)
nRBC: 0 % (ref 0.0–0.2)

## 2021-02-27 LAB — RESP PANEL BY RT-PCR (FLU A&B, COVID) ARPGX2
Influenza A by PCR: NEGATIVE
Influenza B by PCR: NEGATIVE
SARS Coronavirus 2 by RT PCR: NEGATIVE

## 2021-02-27 LAB — DIFFERENTIAL
Abs Immature Granulocytes: 0.03 10*3/uL (ref 0.00–0.07)
Basophils Absolute: 0 10*3/uL (ref 0.0–0.1)
Basophils Relative: 1 %
Eosinophils Absolute: 0.3 10*3/uL (ref 0.0–0.5)
Eosinophils Relative: 4 %
Immature Granulocytes: 0 %
Lymphocytes Relative: 38 %
Lymphs Abs: 2.6 10*3/uL (ref 0.7–4.0)
Monocytes Absolute: 0.8 10*3/uL (ref 0.1–1.0)
Monocytes Relative: 11 %
Neutro Abs: 3.1 10*3/uL (ref 1.7–7.7)
Neutrophils Relative %: 46 %

## 2021-02-27 LAB — CBC
HCT: 25.7 % — ABNORMAL LOW (ref 39.0–52.0)
Hemoglobin: 8.2 g/dL — ABNORMAL LOW (ref 13.0–17.0)
MCH: 26.6 pg (ref 26.0–34.0)
MCHC: 31.9 g/dL (ref 30.0–36.0)
MCV: 83.4 fL (ref 80.0–100.0)
Platelets: 184 10*3/uL (ref 150–400)
RBC: 3.08 MIL/uL — ABNORMAL LOW (ref 4.22–5.81)
RDW: 15 % (ref 11.5–15.5)
WBC: 6.8 10*3/uL (ref 4.0–10.5)
nRBC: 0 % (ref 0.0–0.2)

## 2021-02-27 LAB — TYPE AND SCREEN
ABO/RH(D): O POS
Antibody Screen: NEGATIVE

## 2021-02-27 LAB — POC OCCULT BLOOD, ED: Fecal Occult Bld: POSITIVE — AB

## 2021-02-27 LAB — PROTIME-INR
INR: 1.1 (ref 0.8–1.2)
Prothrombin Time: 13.8 seconds (ref 11.4–15.2)

## 2021-02-27 MED ORDER — PANTOPRAZOLE SODIUM 40 MG IV SOLR: 40.0000 mg | Freq: Every day | INTRAVENOUS | Status: DC

## 2021-02-27 MED ORDER — ACETAMINOPHEN 650 MG RE SUPP: 650.0000 mg | Freq: Four times a day (QID) | RECTAL | Status: DC | PRN

## 2021-02-27 MED ORDER — IOHEXOL 350 MG/ML SOLN
100.0000 mL | Freq: Once | INTRAVENOUS | Status: AC | PRN
  Administered 2021-02-27: 100 mL via INTRAVENOUS

## 2021-02-27 MED ORDER — PANTOPRAZOLE SODIUM 40 MG IV SOLR
40.0000 mg | Freq: Two times a day (BID) | INTRAVENOUS | Status: DC
  Administered 2021-02-27 – 2021-03-01 (×4): 40 mg via INTRAVENOUS
  Filled 2021-02-27 (×4): qty 40

## 2021-02-27 MED ORDER — SODIUM CHLORIDE 0.9 % IV SOLN: Freq: Once | INTRAVENOUS | Status: AC

## 2021-02-27 MED ORDER — ACETAMINOPHEN 325 MG PO TABS: 650.0000 mg | ORAL_TABLET | Freq: Four times a day (QID) | ORAL | Status: DC | PRN

## 2021-02-27 MED ORDER — HYDROCODONE-ACETAMINOPHEN 5-325 MG PO TABS: 1.0000 | ORAL_TABLET | ORAL | Status: DC | PRN

## 2021-02-27 NOTE — ED Provider Notes (Signed)
[facility] EMERGENCY DEPARTMENT Provider Note   CSN: [id number] Arrival date & time: 02/27/21  1125     History Chief Complaint  Patient presents with   Abdominal Pain   Rectal Bleeding    Darren Sullivan is a 55 y.o. male.  55 year old male presents to the emergency department for evaluation of ongoing rectal bleeding.  Reports that symptoms began around lunchtime on Monday when he was at work.  Initially blood was mixed with stool.  Describes the blood as bright red.  Transitioned to a liquid diet on Tuesday and Wednesday, thus did not have a bowel movement again until this morning.  Reports that his initial bowel movement today was notable for darker blood and clots.  This has since resumed back to more bright red bleeding.  He reports feeling increasingly fatigued as well as lightheaded.  He notes some dyspnea on exertion.  No associated syncope, fevers.  Complained of some abdominal cramping in triage which is presently resolved.  He has a history of diverticular bleed necessitating blood transfusion in 2015.  Readmitted for similar clinical picture in 2018; bleeding resolved without intervention.  The patient is not chronically anticoagulated.  Has been followed by [facility] gastroenterology.  The history is provided by the patient. No language interpreter was used.  Abdominal Pain Associated symptoms: hematochezia   Rectal Bleeding Associated symptoms: abdominal pain       Past Medical History:  Diagnosis Date   Diverticulosis of colon with hemorrhage 2015   needed blood transfusion x 1 for Hgb of 7.9.     Renal agenesis     Patient Active Problem List   Diagnosis Date Noted   Diverticulosis of colon with hemorrhage    RUQ pain    Lower GI bleed 03/07/2017   Pleuritic chest pain 01/06/2014   Acute blood loss anemia 01/05/2014   GI bleed 01/04/2014   Rectal bleeding 01/04/2014   GI bleed due to NSAIDs 01/04/2014    Past Surgical History:  Procedure  Laterality Date   BACK SURGERY     COLONOSCOPY WITH PROPOFOL N/A 01/05/2014   Procedure: COLONOSCOPY WITH PROPOFOL;  Surgeon: Cleotis Nipper, MD;  Location: [facility] ENDOSCOPY;  Service: Endoscopy;  Laterality: N/A;   ESOPHAGOGASTRODUODENOSCOPY N/A 01/04/2014   Procedure: ESOPHAGOGASTRODUODENOSCOPY (EGD);  Surgeon: Cleotis Nipper, MD;  Location: [facility] ENDOSCOPY;  Service: Endoscopy;  Laterality: N/A;   EYE SURGERY     FLEXIBLE SIGMOIDOSCOPY N/A 01/04/2014   Procedure: FLEXIBLE SIGMOIDOSCOPY;  Surgeon: Cleotis Nipper, MD;  Location: [facility];  Service: Endoscopy;  Laterality: N/A;   HAND SURGERY     urethra recontruction surgery     URETHRA SURGERY         History reviewed. No pertinent family history.  Social History   Tobacco Use   Smoking status: Never   Smokeless tobacco: Never  Substance Use Topics   Alcohol use: Yes    Comment: wine every now and then; rare   Drug use: No    Home Medications Prior to Admission medications   Medication Sig Start Date End Date Taking? Authorizing Provider  acetaminophen (TYLENOL) 325 MG tablet Take 2 tablets (650 mg total) by mouth every 6 (six) hours as needed for mild pain (or Fever >/= 101). Patient not taking: Reported on 03/07/2017 01/06/14   Delfina Redwood, MD  naproxen sodium (ALEVE) 220 MG tablet Take 220 mg by mouth daily as needed (pain).    [provider]  tadalafil (CIALIS) 20  MG tablet Take 1 tablet (20 mg total) by mouth daily as needed for erectile dysfunction. 10/29/17   Fulp, Cammie, MD  traMADol (ULTRAM) 50 MG tablet Take 50 mg by mouth 4 (four) times daily as needed for moderate pain.  09/06/18   [provider]    Allergies    Penicillins  Review of Systems   Review of Systems  Gastrointestinal:  Positive for abdominal pain and hematochezia.  Ten systems reviewed and are negative for acute change, except as noted in the HPI.    Physical Exam Updated Vital Signs BP 115/75   Pulse 71    Temp 99 F (37.2 C)   Resp 20   SpO2 100%   Physical Exam Vitals and nursing note reviewed.  Constitutional:      General: He is not in acute distress.    Appearance: He is well-developed. He is not diaphoretic.     Comments: Nontoxic appearing  HENT:     Head: Normocephalic and atraumatic.  Eyes:     General: No scleral icterus.    Conjunctiva/sclera: Conjunctivae normal.  Cardiovascular:     Rate and Rhythm: Normal rate and regular rhythm.     Pulses: Normal pulses.  Pulmonary:     Effort: Pulmonary effort is normal. No respiratory distress.     Comments: Respirations even and unlabored Abdominal:     General: There is no distension.     Palpations: Abdomen is soft.  Genitourinary:    Comments: Deferred due to patient preference. Will obtain occult sample when patient has a BM. Musculoskeletal:        General: Normal range of motion.     Cervical back: Normal range of motion.  Skin:    General: Skin is warm and dry.     Coloration: Skin is not pale.     Findings: No erythema or rash.  Neurological:     Mental Status: He is alert and oriented to person, place, and time.     Coordination: Coordination normal.  Psychiatric:        Behavior: Behavior normal.    ED Results / Procedures / Treatments   Labs (all labs ordered are listed, but only abnormal results are displayed) Labs Reviewed  CBC WITH DIFFERENTIAL/PLATELET - Abnormal; Notable for the following components:      Result Value   WBC 11.4 (*)    RBC 3.49 (*)    Hemoglobin 9.2 (*)    HCT 30.3 (*)    Monocytes Absolute 1.1 (*)    All other components within normal limits  COMPREHENSIVE METABOLIC PANEL - Abnormal; Notable for the following components:   Total Bilirubin 1.7 (*)    All other components within normal limits  POC OCCULT BLOOD, ED - Abnormal; Notable for the following components:   Fecal Occult Bld POSITIVE (*)    All other components within normal limits  RESP PANEL BY RT-PCR (FLU A&B, COVID)  ARPGX2  PROTIME-INR  TYPE AND SCREEN  SAMPLE TO BLOOD BANK    EKG None  Radiology CT ANGIO GI BLEED  Result Date: 02/27/2021 CLINICAL DATA:  Left lower quadrant pain, bloody stools for 4 days, history of diverticulosis and hemorrhage EXAM: CTA ABDOMEN AND PELVIS WITHOUT AND WITH CONTRAST TECHNIQUE: Multidetector CT imaging of the abdomen and pelvis was performed using the standard protocol during bolus administration of intravenous contrast. Multiplanar reconstructed images and MIPs were obtained and reviewed to evaluate the vascular anatomy. CONTRAST:  OMNIPAQUE IOHEXOL 350 MG/ML SOLN  COMPARISON:  09/15/2018 FINDINGS: VASCULAR Normal contour and caliber of the abdominal aorta. No evidence of aneurysm, dissection, or other acute aortic pathology. Standard branching pattern of the abdominal aorta with a solitary left renal artery. Moderate mixed calcific atherosclerosis of the infrarenal abdominal aorta. Review of the MIP images confirms the above findings. NON-VASCULAR Lower chest: No acute abnormality. Hepatobiliary: No focal liver abnormality is seen. No gallstones, gallbladder wall thickening, or biliary dilatation. Pancreas: Unremarkable. No pancreatic ductal dilatation or surrounding inflammatory changes. Spleen: Normal in size without focal abnormality. Adrenals/Urinary Tract: Adrenal glands are unremarkable. The right kidney is severely atrophic with a small rest or remnant in the right renal fossa (series 10, image 48). The left kidney is normal, without renal calculi, focal lesion, or hydronephrosis. Bladder is unremarkable. Stomach/Bowel: Stomach is within normal limits. Appendix appears normal. No evidence of bowel wall thickening, distention, or inflammatory changes. Transverse, descending, and sigmoid diverticulosis. Lymphatic: No significant vascular findings are present. No enlarged abdominal or pelvic lymph nodes. Reproductive: Uterus and bilateral adnexa are unremarkable. Other: No  abdominal wall hernia or abnormality. No abdominopelvic ascites. Musculoskeletal: No acute or significant osseous findings. IMPRESSION: 1. Normal contour and caliber of the abdominal aorta. No evidence of aneurysm, dissection, or other acute aortic pathology. Moderate mixed calcific atherosclerosis. 2. Colonic diverticulosis without evidence of acute diverticulitis or findings to localize GI bleeding. No intraluminal contrast extravasation identified. 3. Severely atrophic right kidney. Electronically Signed   By: Delanna Ahmadi M.D.   On: 02/27/2021 14:23    Procedures Procedures   Medications Ordered in ED Medications  0.9 %  sodium chloride infusion (has no administration in time range)  iohexol (OMNIPAQUE) 350 MG/ML injection 100 mL (100 mLs Intravenous Contrast Given 02/27/21 1411)    ED Course  I have reviewed the triage vital signs and the nursing notes.  Pertinent labs & imaging results that were available during my care of the patient were reviewed by me and considered in my medical decision making (see chart for details).  Clinical Course as of 02/27/21 1918  Thu Feb 27, 2021  1856 Dr. Alessandra Bevels of [facility] aware of patient. Their service will plan to round on him in the AM.  [KH]  1917 Case discussed with Dr. Roel Cluck of [facility] who will admit. [KH]    Clinical Course User Index [KH] Beverely Pace   MDM Rules/Calculators/A&P                           55 year old male to be admitted to the hospitalist service for ongoing trending of his CBC in the setting of rectal bleeding which began on Monday.  Has presumed 5g Hgb drop since onset of symptoms when CBC compared to ED evaluation in 2020.  He feels fatigued with dyspnea on exertion, lightheadedness, but has been hemodynamically stable in the ED without tachycardia, hypotension, hypoxia.    Has been followed by [facility] in an outpatient setting. Their service to consult in AM. Dr. Roel Cluck of [facility] to admit.   Final Clinical  Impression(s) / ED Diagnoses Final diagnoses:  Rectal bleeding    Rx / DC Orders ED Discharge Orders     None        Antonietta Breach, PA-C 02/27/21 1921    Regan Lemming, MD 02/27/21 [phone]

## 2021-02-27 NOTE — ED Notes (Signed)
Patient is being discharged from the Urgent Care and sent to the Emergency Department via POV . Per Hagler,MD, patient is in need of higher level of care due to need of further evaluation . Patient is aware and verbalizes understanding of plan of care.  Vitals:   02/27/21 1056  BP: 130/67  Pulse: 78  Resp: 18  Temp: 99.2 F (37.3 C)  SpO2: 98%
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
## 2021-02-27 NOTE — ED Triage Notes (Signed)
Pt is present today with bloody stools and left abdominal pain. Pt states sx started Monday

## 2021-02-27 NOTE — ED Provider Notes (Signed)
Emergency Medicine Provider Triage Evaluation Note  Darren Sullivan , a 55 y.o. male  was evaluated in triage.  Pt complains of abdominal pain and blood in the stool.  Patient states he has had pain since Monday, and noticed blood in his stool since Monday.  Initially was bright red, noticed dark black.  Normally when he has these episodes, he is able to treated at home with a liquid diet and time, however this time it is worsening, not improving.  He reports a low-grade fever and chills.  He states he feels weak and tired.  He has no nausea, vomiting, urinary symptoms.  He is not on blood thinners. h/o diverticulitis  Review of Systems  Positive: LLQ abd pain, gi bleed, chills, weakness Negative: N/v  Physical Exam  BP 112/81 (BP Location: Left Arm)   Pulse 67   Temp 98.9 F (37.2 C) (Oral)   Resp 14   SpO2 100%  Gen:   Awake, no distress   Resp:  Normal effort  MSK:   Moves extremities without difficulty  Other:  Diffuse TTP of the abdomen, worse in the left lower quadrant.  Medical Decision Making  Medically screening exam initiated at 12:01 PM.  Appropriate orders placed.  Neal Trulson was informed that the remainder of the evaluation will be completed by another provider, this initial triage assessment does not replace that evaluation, and the importance of remaining in the ED until their evaluation is complete.  Labs, cta abd   Alveria Apley, PA-C 02/27/21 1204    Milagros Loll, MD 02/28/21 [phone]

## 2021-02-27 NOTE — ED Provider Notes (Signed)
[facility]ENTER   [id number] 02/27/21 Arrival Time: 0903  ASSESSMENT & PLAN:  1. Left lower quadrant abdominal pain   2. Rectal bleeding    Significant TTP of LLQ with reported active BRBPR. Low grade temp here. H/O diverticulosis with hemorrhage. Cannot r/o colon perforation or infection here. Discussed need for ED evaluation. Agrees. D/C via private vehicle to ED.   Follow-up Information     Go to  [facility] EMERGENCY DEPARTMENT.   Specialty: Emergency Medicine Contact information: [address] [id number] [name] [address] [phone]               Reviewed expectations re: course of current medical issues. Questions answered. Outlined signs and symptoms indicating need for more acute intervention. Patient verbalized understanding. After Visit Summary given.   SUBJECTIVE: History from: patient. Darren Sullivan is a 55 y.o. male who presents with complaint of LLQ pain with BRBPR. Onset approx 48 hours ago; bleeding more today. "Almost passed out in the shower". No fever reported. Slight nausea this am without emesis. H/O diverticulosis with hemorrhage. Does not see GI specialist currently. Ambulatory. No tx PTA.  Past Surgical History:  Procedure Laterality Date   BACK SURGERY     COLONOSCOPY WITH PROPOFOL N/A 01/05/2014   Procedure: COLONOSCOPY WITH PROPOFOL;  Surgeon: Florencia Reasons, MD;  [facility] ENDOSCOPY;  Service: Endoscopy;  Laterality: N/A;   ESOPHAGOGASTRODUODENOSCOPY N/A 01/04/2014   Procedure: ESOPHAGOGASTRODUODENOSCOPY (EGD);  Surgeon: Florencia Reasons, MD;  Location: [facility] ENDOSCOPY;  Service: Endoscopy;  Laterality: N/A;   EYE SURGERY     FLEXIBLE SIGMOIDOSCOPY N/A 01/04/2014   Procedure: FLEXIBLE SIGMOIDOSCOPY;  Surgeon: Florencia Reasons, MD;  Location: [facility] ENDOSCOPY;  Service: Endoscopy;  Laterality: N/A;   HAND SURGERY     urethra recontruction surgery     URETHRA SURGERY        OBJECTIVE:  Vitals:   02/27/21 1056  BP: 130/67  Pulse: 78  Resp: 18  Temp: 99.2 F (37.3 C)  SpO2: 98%    General appearance: alert, oriented, no acute distress HEENT: [place]; AT; oropharynx moist Lungs: unlabored respirations Abdomen: soft; without distention; moderate and poorly localized tenderness to palpation over LLQ ; withoutunds;  masses or organomegaly; without guarding or rebound tenderness Rectal: deferred Back: without reported CVA tenderness; FROM at waist Extremities: without LE edema; symmetrical; without gross deformities Skin: warm and dry Neurologic: normal gait Psychological: alert and cooperative; normal mood and affect   Allergies  Allergen Reactions   Penicillins Hives    Has patient had a PCN reaction causing immediate rash, facial/tongue/throat swelling, SOB or lightheadedness with hypotension: No Has patient had a PCN reaction causing severe rash involving mucus membranes or skin necrosis: No Has patient had a PCN reaction that required hospitalization: No Has patient had a PCN reaction occurring within the last 10 years: No If all of the above answers are "NO", then may proceed with Cephalosporin use.                                               Past Medical History:  Diagnosis Date   Diverticulosis of colon with hemorrhage 2015   needed blood transfusion x 1 for Hgb of 7.9.     Renal agenesis     Social History   Socioeconomic History   Marital status: Married  Spouse name: Not on file   Number of children: Not on file   Years of education: Not on file   Highest education level: Not on file  Occupational History   Not on file  Tobacco Use   Smoking status: Never   Smokeless tobacco: Never  Substance and Sexual Activity   Alcohol use: Yes    Comment: wine every now and then; rare   Drug use: No   Sexual activity: Not on file  Other Topics Concern   Not on file  Social History Narrative   Not on file   Social Determinants  of Health   Financial Resource Strain: Not on file  Food Insecurity: Not on file  Transportation Needs: Not on file  Physical Activity: Not on file  Stress: Not on file  Social Connections: Not on file  Intimate Partner Violence: Not on file    History reviewed. No pertinent family history.   [name], MD 02/27/21 1122

## 2021-02-27 NOTE — ED Triage Notes (Signed)
Pt reports hx of diverticulitis, having rectal bleeding since Monday that started as bright red and is now darker. Abd pain is left side. Denies n/v.

## 2021-02-28 LAB — URINALYSIS, ROUTINE W REFLEX MICROSCOPIC
Bilirubin Urine: NEGATIVE
Glucose, UA: NEGATIVE mg/dL
Ketones, ur: 40 mg/dL — AB
Nitrite: POSITIVE — AB
Protein, ur: NEGATIVE mg/dL
Specific Gravity, Urine: 1.02 (ref 1.005–1.030)
pH: 6 (ref 5.0–8.0)

## 2021-02-28 LAB — COMPREHENSIVE METABOLIC PANEL
ALT: 18 U/L (ref 0–44)
AST: 17 U/L (ref 15–41)
Albumin: 3.6 g/dL (ref 3.5–5.0)
Alkaline Phosphatase: 47 U/L (ref 38–126)
Anion gap: 5 (ref 5–15)
BUN: 10 mg/dL (ref 6–20)
CO2: 26 mmol/L (ref 22–32)
Calcium: 8.7 mg/dL — ABNORMAL LOW (ref 8.9–10.3)
Chloride: 109 mmol/L (ref 98–111)
Creatinine, Ser: 1.1 mg/dL (ref 0.61–1.24)
GFR, Estimated: >60 mL/min (ref >60)
Glucose, Bld: 87 mg/dL (ref 70–99)
Potassium: 3.9 mmol/L (ref 3.5–5.1)
Sodium: 140 mmol/L (ref 135–145)
Total Bilirubin: 1.4 mg/dL — ABNORMAL HIGH (ref 0.3–1.2)
Total Protein: 6.2 g/dL — ABNORMAL LOW (ref 6.5–8.1)

## 2021-02-28 LAB — CBC WITH DIFFERENTIAL/PLATELET
Abs Immature Granulocytes: 0.02 10*3/uL (ref 0.00–0.07)
Basophils Absolute: 0 10*3/uL (ref 0.0–0.1)
Basophils Relative: 0 %
Eosinophils Absolute: 0.2 10*3/uL (ref 0.0–0.5)
Eosinophils Relative: 3 %
HCT: 27.7 % — ABNORMAL LOW (ref 39.0–52.0)
Hemoglobin: 8.7 g/dL — ABNORMAL LOW (ref 13.0–17.0)
Immature Granulocytes: 0 %
Lymphocytes Relative: 36 %
Lymphs Abs: 2.5 10*3/uL (ref 0.7–4.0)
MCH: 26.3 pg (ref 26.0–34.0)
MCHC: 31.4 g/dL (ref 30.0–36.0)
MCV: 83.7 fL (ref 80.0–100.0)
Monocytes Absolute: 0.7 10*3/uL (ref 0.1–1.0)
Monocytes Relative: 11 %
Neutro Abs: 3.4 10*3/uL (ref 1.7–7.7)
Neutrophils Relative %: 50 %
Platelets: 198 10*3/uL (ref 150–400)
RBC: 3.31 MIL/uL — ABNORMAL LOW (ref 4.22–5.81)
RDW: 14.7 % (ref 11.5–15.5)
WBC: 6.9 10*3/uL (ref 4.0–10.5)
nRBC: 0 % (ref 0.0–0.2)

## 2021-02-28 LAB — TSH: TSH: 1.229 u[IU]/mL (ref 0.350–4.500)

## 2021-02-28 LAB — MAGNESIUM
Magnesium: 2 mg/dL (ref 1.7–2.4)
Magnesium: 2 mg/dL (ref 1.7–2.4)

## 2021-02-28 LAB — HEPATIC FUNCTION PANEL
ALT: 18 U/L (ref 0–44)
AST: 16 U/L (ref 15–41)
Albumin: 3.4 g/dL — ABNORMAL LOW (ref 3.5–5.0)
Alkaline Phosphatase: 41 U/L (ref 38–126)
Bilirubin, Direct: 0.1 mg/dL (ref 0.0–0.2)
Indirect Bilirubin: 0.9 mg/dL (ref 0.3–0.9)
Total Bilirubin: 1 mg/dL (ref 0.3–1.2)
Total Protein: 6.3 g/dL — ABNORMAL LOW (ref 6.5–8.1)

## 2021-02-28 LAB — HEMOGLOBIN AND HEMATOCRIT, BLOOD
HCT: 25.4 % — ABNORMAL LOW (ref 39.0–52.0)
Hemoglobin: 8.3 g/dL — ABNORMAL LOW (ref 13.0–17.0)

## 2021-02-28 LAB — HIV ANTIBODY (ROUTINE TESTING W REFLEX): HIV Screen 4th Generation wRfx: NONREACTIVE

## 2021-02-28 LAB — URINALYSIS, MICROSCOPIC (REFLEX): Squamous Epithelial / HPF: NONE SEEN (ref 0–5)

## 2021-02-28 LAB — PHOSPHORUS
Phosphorus: 3.9 mg/dL (ref 2.5–4.6)
Phosphorus: 3.8 mg/dL (ref 2.5–4.6)

## 2021-02-28 LAB — CK: Total CK: 66 U/L (ref 49–397)

## 2021-02-28 MED ORDER — SODIUM CHLORIDE 0.9 % IV SOLN: INTRAVENOUS | Status: DC

## 2021-02-28 NOTE — TOC Initial Note (Signed)
Transition of Care [facility]) - Initial/Assessment Note    Patient Details  Name: Darren Sullivan MRN: [id number] Date of Birth: 01/05/1966  Transition of Care [facility]) CM/SW Contact:    Marilu Favre, RN Phone Number: 02/28/2021, 2:41 PM  Clinical Narrative:                 Spoke to patient at bedside. Patient from home with wife.   Will assist with medications through [facility] at discharge.   Patient does not have a PCP. [facility] listed. Patient stated that was awhile ago and does not wish to go back to [place]. He works close to Patient [place]. NCM scheduled appointment and placed on AVS. Patient aware  Expected Discharge Plan: Home/Self Care Barriers to Discharge: Continued Medical Work up   Patient Goals and CMS Choice Patient states their goals for this hospitalization and ongoing recovery are:: to return to home CMS Medicare.gov Compare Post Acute Care list provided to:: Patient Choice offered to / list presented to : Patient  Expected Discharge Plan and Services Expected Discharge Plan: Home/Self Care   Discharge Planning Services: CM Consult, [facility], [place], Medication Assistance   Living arrangements for the past 2 months: Single Family Home                 DME Arranged: N/A DME Agency: NA       HH Arranged: NA          Prior Living Arrangements/Services Living arrangements for the past 2 months: Single Family Home Lives with:: Spouse Patient language and need for interpreter reviewed:: Yes Do you feel safe going back to the place where you live?: Yes      Need for Family Participation in Patient Care: Yes (Comment) Care giver support system in place?: Yes (comment)   Criminal Activity/Legal Involvement Pertinent to Current Situation/Hospitalization: No - Comment as needed  Activities of Daily Living Home Assistive Devices/Equipment: None ADL Screening (condition at time of admission) Patient's cognitive ability  adequate to safely complete daily activities?: Yes Is the patient deaf or have difficulty hearing?: No Does the patient have difficulty seeing, even when wearing glasses/contacts?: No Does the patient have difficulty concentrating, remembering, or making decisions?: No Patient able to express need for assistance with ADLs?: Yes Does the patient have difficulty dressing or bathing?: No Independently performs ADLs?: Yes (appropriate for developmental age) Does the patient have difficulty walking or climbing stairs?: No Weakness of Legs: None Weakness of Arms/Hands: None  Permission Sought/Granted   Permission granted to share information with : No              Emotional Assessment Appearance:: Appears stated age Attitude/Demeanor/Rapport: Engaged Affect (typically observed): Accepting Orientation: : Oriented to Self, Oriented to Place, Oriented to  Time, Oriented to Situation Alcohol / Substance Use: Not Applicable Psych Involvement: No (comment)  Admission diagnosis:  Rectal bleeding [K62.5] Lower GI bleed [K92.2] Patient Active Problem List   Diagnosis Date Noted   Diverticulosis of colon with hemorrhage    RUQ pain    Lower GI bleed 03/07/2017   Pleuritic chest pain 01/06/2014   Acute blood loss anemia 01/05/2014   GI bleed 01/04/2014   Rectal bleeding 01/04/2014   GI bleed due to NSAIDs 01/04/2014   PCP:  Antony Blackbird, MD (Inactive) Pharmacy:   [facility], [place]  [place] [address] Phone: [phone] Fax: [phone]  [facility] [address] [place] [address] Phone: [phone] Fax: [phone]     Social Determinants of Health (SDOH) Interventions    Readmission Risk Interventions No flowsheet data found.
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
## 2021-03-01 LAB — HEMOGLOBIN AND HEMATOCRIT, BLOOD
HCT: 25.3 % — ABNORMAL LOW (ref 39.0–52.0)
Hemoglobin: 8.3 g/dL — ABNORMAL LOW (ref 13.0–17.0)

## 2021-03-01 NOTE — Plan of Care (Signed)
Patient ID: Darren Sullivan, male   DOB: 11/11/1965, 55 y.o.   MRN: [id number]  Problem: Education: Goal: Knowledge of General Education information will improve Description: Including pain rating scale, medication(s)/side effects and non-pharmacologic comfort measures Outcome: Adequate for Discharge   Problem: Health Behavior/Discharge Planning: Goal: Ability to manage health-related needs will improve Outcome: Adequate for Discharge   Problem: Pain Managment: Goal: General experience of comfort will improve Outcome: Adequate for Discharge   Problem: Safety: Goal: Ability to remain free from injury will improve Outcome: Adequate for Discharge  Lidia Collum, RN

## 2021-03-01 NOTE — TOC Transition Note (Signed)
Transition of Care [facility]) - CM/SW Discharge Note   Patient Details  Name: Darren Sullivan MRN: [id number] Date of Birth: Aug 13, 1965  Transition of Care [facility]) CM/SW Contact:  Bess Kinds, RN Phone Number: [phone] 03/01/2021, 2:06 PM   Clinical Narrative:     Follow to previous CM's post acute transition plans. No transition medications. [facility] by [facility] CM. No additional transition needs identified at this time.   Final next level of care: Home/Self Care Barriers to Discharge: No Barriers Identified   Patient Goals and CMS Choice Patient states their goals for this hospitalization and ongoing recovery are:: to return to home CMS Medicare.gov Compare Post Acute Care list provided to:: Patient Choice offered to / list presented to : Patient  Discharge Placement                       Discharge Plan and Services   Discharge Planning Services: CM Consult, Indigent Health Clinic, [facility] Program, Medication Assistance            DME Arranged: N/A DME Agency: NA       HH Arranged: NA          Social Determinants of Health (SDOH) Interventions     Readmission Risk Interventions No flowsheet data found.

## 2021-03-01 NOTE — Progress Notes (Signed)
[facility] Gastroenterology Progress Note  Darren Sullivan 55 y.o. 11-14-1965   Subjective: Feels good. Brown stool today. Denies red blood in stool today.  Objective: Vital signs: Vitals:   03/01/21 0528 03/01/21 0848  BP: 131/75 122/81  Pulse: 71 68  Resp:  18  Temp: 98.2 F (36.8 C) 98.3 F (36.8 C)  SpO2: 100% 100%    Physical Exam: Gen: alert, no acute distress, well-nourished HEENT: anicteric sclera CV: RRR Chest: CTA B Abd: LLQ tenderness with minimal guarding, soft, nondistended, +BS Ext: no edema  Lab Results: Recent Labs    02/27/21 1128 02/27/21 2309 02/28/21 0742  NA 137  --  140  K 4.3  --  3.9  CL 105  --  109  CO2 23  --  26  GLUCOSE 99  --  87  BUN 8  --  10  CREATININE 1.19  --  1.10  CALCIUM 9.1  --  8.7*  MG  --  2.0 2.0  PHOS  --  3.9 3.8   Recent Labs    02/27/21 2309 02/28/21 0742  AST 16 17  ALT 18 18  ALKPHOS 41 47  BILITOT 1.0 1.4*  PROT 6.3* 6.2*  ALBUMIN 3.4* 3.6   Recent Labs    02/27/21 2309 02/28/21 0742 02/28/21 1849 03/01/21 0425  WBC 6.8 6.9  --   --   NEUTROABS 3.1 3.4  --   --   HGB 8.2* 8.7* 8.3* 8.3*  HCT 25.7* 27.7* 25.4* 25.3*  MCV 83.4 83.7  --   --   PLT 184 198  --   --       Assessment/Plan: Lower GI bleed likely diverticular that has resolved. Ok to go home and f/u with me in 4 weeks. Will need an outpt colonoscopy in near future and that will be arranged during f/u visit. Will sign off. Call if questions.   [name] 03/01/2021, 1:21 PM  Questions please call [phone] Patient ID: Darren Sullivan, male   DOB: 09-27-1965, 55 y.o.   MRN: [id number]

## 2021-03-01 NOTE — Discharge Summary (Signed)
Physician Discharge Summary  Darren Sullivan [id number]:[id number] DOB: Aug 16, 1965 DOA: 02/27/2021  PCP: Darren Crook I, NP  Admit date: 02/27/2021 Discharge date: 03/01/2021  Admitted From: Home Disposition: Home  Recommendations for Outpatient Follow-up:  Follow up with PCP as a scheduled next week Please recheck hemoglobin in 1 week  Home Health: N/A Equipment/Devices: N/A  Discharge Condition: Stable CODE STATUS: Full code Diet recommendation: Regular diet.  Take stool softener if experiencing any constipation.  Discharge summary:  55 year old gentleman with history of known diverticulosis and previous episodes of diverticular bleed including need for blood transfusions presented with 3 episodes of bright red blood per rectum with some associated abdominal cramping.  He was hemodynamically stable at the emergency room.  Underwent CT angiogram with no evidence of active bleeding.  Admit to the hospital for monitoring, treated with IV fluids and close hemoglobin monitoring.  During hospitalization, last 48 hours with no evidence of ongoing bleeding.  He had normal bowel movement with no traces of blood.  Reported baseline hemoglobin 14.1 year ago.  Presentation hemoglobin was 9.2-8.7-8.3-8.2 on IV fluids.  Seen and followed by gastroenterology. Diverticular bleed which has spontaneously stopped.  GI recommended conservative management.  Discharged home with close follow-up. Recheck follow-up in 1 week with hemoglobin. Report for any large amount of bleeding.   Discharge Diagnoses:  Principal Problem:   Lower GI bleed Active Problems:   Acute blood loss anemia    Discharge Instructions  Discharge Instructions     Call MD for:   Complete by: As directed    Any evidence of fresh rectal bleeding,   Diet general   Complete by: As directed    Avoid constipation.  If needed you can take some over-the-counter stool softener and laxatives.   Increase activity slowly   Complete by:  As directed       Allergies as of 03/01/2021       Reactions   Penicillins Hives   Has patient had a PCN reaction causing immediate rash, facial/tongue/throat swelling, SOB or lightheadedness with hypotension: No Has patient had a PCN reaction causing severe rash involving mucus membranes or skin necrosis: No Has patient had a PCN reaction that required hospitalization: No Has patient had a PCN reaction occurring within the last 10 years: No If all of the above answers are "NO", then may proceed with Cephalosporin use.        Medication List     STOP taking these medications    naproxen sodium 220 MG tablet Commonly known as: ALEVE       TAKE these medications    acetaminophen 325 MG tablet Commonly known as: TYLENOL Take 2 tablets (650 mg total) by mouth every 6 (six) hours as needed for mild pain (or Fever >/= 101).   tadalafil 10 MG tablet Commonly known as: CIALIS Take 10 mg by mouth daily as needed for erectile dysfunction. What changed: Another medication with the same name was removed. Continue taking this medication, and follow the directions you see here.        Follow-up Information     Passmore, Enid Derry I, NP Follow up.   Specialty: Nurse Practitioner Why: Friday 03/07/21 at 0940 am Contact information: [address] [place] [address] [phone]                Allergies  Allergen Reactions   Penicillins Hives    Has patient had a PCN reaction causing immediate rash, facial/tongue/throat swelling, SOB or lightheadedness with hypotension:  No Has patient had a PCN reaction causing severe rash involving mucus membranes or skin necrosis: No Has patient had a PCN reaction that required hospitalization: No Has patient had a PCN reaction occurring within the last 10 years: No If all of the above answers are "NO", then may proceed with Cephalosporin use.    Consultations: [facility]   Procedures/Studies: CT ANGIO GI BLEED  Result  Date: 02/27/2021 CLINICAL DATA:  Left lower quadrant pain, bloody stools for 4 days, history of diverticulosis and hemorrhage EXAM: CTA ABDOMEN AND PELVIS WITHOUT AND WITH CONTRAST TECHNIQUE: Multidetector CT imaging of the abdomen and pelvis was performed using the standard protocol during bolus administration of intravenous contrast. Multiplanar reconstructed images and MIPs were obtained and reviewed to evaluate the vascular anatomy. CONTRAST:  OMNIPAQUE IOHEXOL 350 MG/ML SOLN COMPARISON:  09/15/2018 FINDINGS: VASCULAR Normal contour and caliber of the abdominal aorta. No evidence of aneurysm, dissection, or other acute aortic pathology. Standard branching pattern of the abdominal aorta with a solitary left renal artery. Moderate mixed calcific atherosclerosis of the infrarenal abdominal aorta. Review of the MIP images confirms the above findings. NON-VASCULAR Lower chest: No acute abnormality. Hepatobiliary: No focal liver abnormality is seen. No gallstones, gallbladder wall thickening, or biliary dilatation. Pancreas: Unremarkable. No pancreatic ductal dilatation or surrounding inflammatory changes. Spleen: Normal in size without focal abnormality. Adrenals/Urinary Tract: Adrenal glands are unremarkable. The right kidney is severely atrophic with a small rest or remnant in the right renal fossa (series 10, image 48). The left kidney is normal, without renal calculi, focal lesion, or hydronephrosis. Bladder is unremarkable. Stomach/Bowel: Stomach is within normal limits. Appendix appears normal. No evidence of bowel wall thickening, distention, or inflammatory changes. Transverse, descending, and sigmoid diverticulosis. Lymphatic: No significant vascular findings are present. No enlarged abdominal or pelvic lymph nodes. Reproductive: Uterus and bilateral adnexa are unremarkable. Other: No abdominal wall hernia or abnormality. No abdominopelvic ascites. Musculoskeletal: No acute or significant osseous  findings. IMPRESSION: 1. Normal contour and caliber of the abdominal aorta. No evidence of aneurysm, dissection, or other acute aortic pathology. Moderate mixed calcific atherosclerosis. 2. Colonic diverticulosis without evidence of acute diverticulitis or findings to localize GI bleeding. No intraluminal contrast extravasation identified. 3. Severely atrophic right kidney. Electronically Signed   By: Jearld Lesch M.D.   On: 02/27/2021 14:23   (Echo, Carotid, EGD, Colonoscopy, ERCP)    Subjective: Patient seen and examined in the morning rounds.  He is eager to get out of the hospital.  Denies any complaints.  He is well versed about diverticular bleeding and will seek treatment if he has any recurrent excess amount of blood.   Discharge Exam: Vitals:   03/01/21 0528 03/01/21 0848  BP: 131/75 122/81  Pulse: 71 68  Resp:  18  Temp: 98.2 F (36.8 C) 98.3 F (36.8 C)  SpO2: 100% 100%   Vitals:   02/28/21 1624 02/28/21 2108 03/01/21 0528 03/01/21 0848  BP: 126/69 (!) 142/80 131/75 122/81  Pulse: 61 84 71 68  Resp: 18   18  Temp: 97.9 F (36.6 C) 98.3 F (36.8 C) 98.2 F (36.8 C) 98.3 F (36.8 C)  TempSrc:  Oral Oral Axillary  SpO2: 100% 100% 100% 100%  Weight:      Height:        General: Pt is alert, awake, not in acute distress Cardiovascular: RRR, S1/S2 +, no rubs, no gallops Respiratory: CTA bilaterally, no wheezing, no rhonchi Abdominal: Soft, NT, ND, bowel sounds + Extremities:  no edema, no cyanosis    The results of significant diagnostics from this hospitalization (including imaging, microbiology, ancillary and laboratory) are listed below for reference.     Microbiology: Recent Results (from the past 240 hour(s))  Resp Panel by RT-PCR (Flu A&B, Covid) Nasopharyngeal Swab     Status: None   Collection Time: 02/27/21  6:25 PM   Specimen: Nasopharyngeal Swab; Nasopharyngeal(NP) swabs in vial transport medium  Result Value Ref Range Status   SARS Coronavirus 2 by  RT PCR NEGATIVE NEGATIVE Final    Comment: (NOTE) SARS-CoV-2 target nucleic acids are NOT DETECTED.  The SARS-CoV-2 RNA is generally detectable in upper respiratory specimens during the acute phase of infection. The lowest concentration of SARS-CoV-2 viral copies this assay can detect is 138 copies/mL. A negative result does not preclude SARS-Cov-2 infection and should not be used as the sole basis for treatment or other patient management decisions. A negative result may occur with  improper specimen collection/handling, submission of specimen other than nasopharyngeal swab, presence of viral mutation(s) within the areas targeted by this assay, and inadequate number of viral copies(<138 copies/mL). A negative result must be combined with clinical observations, patient history, and epidemiological information. The expected result is Negative.  Fact Sheet for Patients:  BloggerCourse.com  Fact Sheet for Healthcare Providers:  SeriousBroker.it  This test is no t yet approved or cleared by the [place] FDA and  has been authorized for detection and/or diagnosis of SARS-CoV-2 by FDA under an Emergency Use Authorization (EUA). This EUA will remain  in effect (meaning this test can be used) for the duration of the COVID-19 declaration under Section 564(b)(1) of the Act, 21 U.S.C.section 360bbb-3(b)(1), unless the authorization is terminated  or revoked sooner.       Influenza A by PCR NEGATIVE NEGATIVE Final   Influenza B by PCR NEGATIVE NEGATIVE Final    Comment: (NOTE) The Xpert Xpress SARS-CoV-2/FLU/RSV plus assay is intended as an aid in the diagnosis of influenza from Nasopharyngeal swab specimens and should not be used as a sole basis for treatment. Nasal washings and aspirates are unacceptable for Xpert Xpress SARS-CoV-2/FLU/RSV testing.  Fact Sheet for Patients: BloggerCourse.com  Fact Sheet  for Healthcare Providers: SeriousBroker.it  This test is not yet approved or cleared by the [place] FDA and has been authorized for detection and/or diagnosis of SARS-CoV-2 by FDA under an Emergency Use Authorization (EUA). This EUA will remain in effect (meaning this test can be used) for the duration of the COVID-19 declaration under Section 564(b)(1) of the Act, 21 U.S.C. section 360bbb-3(b)(1), unless the authorization is terminated or revoked.  Performed at [facility], [address] [place], [place] [address]      Labs: BNP (last 3 results) No results for input(s): BNP in the last 8760 hours. Basic Metabolic Panel: Recent Labs  Lab 02/27/21 1128 02/27/21 2309 02/28/21 0742  NA 137  --  140  K 4.3  --  3.9  CL 105  --  109  CO2 23  --  26  GLUCOSE 99  --  87  BUN 8  --  10  CREATININE 1.19  --  1.10  CALCIUM 9.1  --  8.7*  MG  --  2.0 2.0  PHOS  --  3.9 3.8   Liver Function Tests: Recent Labs  Lab 02/27/21 1128 02/27/21 2309 02/28/21 0742  AST ALT ALKPHOS 41 41 47  BILITOT 1.7*  1.0 1.4*  PROT 6.6 6.3* 6.2*  ALBUMIN 3.8 3.4* 3.6   No results for input(s): LIPASE, AMYLASE in the last 168 hours. No results for input(s): AMMONIA in the last 168 hours. CBC: Recent Labs  Lab 02/27/21 1128 02/27/21 2309 02/28/21 0742 02/28/21 1849 03/01/21 0425  WBC 11.4* 6.8 6.9  --   --   NEUTROABS 6.4 3.1 3.4  --   --   HGB 9.2* 8.2* 8.7* 8.3* 8.3*  HCT 30.3* 25.7* 27.7* 25.4* 25.3*  MCV 86.8 83.4 83.7  --   --   PLT 195 184 198  --   --    Cardiac Enzymes: Recent Labs  Lab 02/27/21 2309  CKTOTAL 66   BNP: Invalid input(s): POCBNP CBG: No results for input(s): GLUCAP in the last 168 hours. D-Dimer No results for input(s): DDIMER in the last 72 hours. Hgb A1c No results for input(s): HGBA1C in the last 72 hours. Lipid Profile No results for input(s): CHOL, HDL, LDLCALC, TRIG, CHOLHDL, LDLDIRECT  in the last 72 hours. Thyroid function studies Recent Labs    02/28/21 0430  TSH 1.229   Anemia work up No results for input(s): VITAMINB12, FOLATE, FERRITIN, TIBC, IRON, RETICCTPCT in the last 72 hours. Urinalysis    Component Value Date/Time   COLORURINE YELLOW 02/28/2021 0520   APPEARANCEUR CLEAR 02/28/2021 0520   LABSPEC 1.020 02/28/2021 0520   PHURINE 6.0 02/28/2021 0520   GLUCOSEU NEGATIVE 02/28/2021 0520   HGBUR TRACE (A) 02/28/2021 0520   BILIRUBINUR NEGATIVE 02/28/2021 0520   KETONESUR 40 (A) 02/28/2021 0520   PROTEINUR NEGATIVE 02/28/2021 0520   UROBILINOGEN 0.2 04/16/2007 1820   NITRITE POSITIVE (A) 02/28/2021 0520   LEUKOCYTESUR SMALL (A) 02/28/2021 0520   Sepsis Labs Invalid input(s): PROCALCITONIN,  WBC,  LACTICIDVEN Microbiology Recent Results (from the past 240 hour(s))  Resp Panel by RT-PCR (Flu A&B, Covid) Nasopharyngeal Swab     Status: None   Collection Time: 02/27/21  6:25 PM   Specimen: Nasopharyngeal Swab; Nasopharyngeal(NP) swabs in vial transport medium  Result Value Ref Range Status   SARS Coronavirus 2 by RT PCR NEGATIVE NEGATIVE Final    Comment: (NOTE) SARS-CoV-2 target nucleic acids are NOT DETECTED.  The SARS-CoV-2 RNA is generally detectable in upper respiratory specimens during the acute phase of infection. The lowest concentration of SARS-CoV-2 viral copies this assay can detect is 138 copies/mL. A negative result does not preclude SARS-Cov-2 infection and should not be used as the sole basis for treatment or other patient management decisions. A negative result may occur with  improper specimen collection/handling, submission of specimen other than nasopharyngeal swab, presence of viral mutation(s) within the areas targeted by this assay, and inadequate number of viral copies(<138 copies/mL). A negative result must be combined with clinical observations, patient history, and epidemiological information. The expected result is  Negative.  Fact Sheet for Patients:  BloggerCourse.com  Fact Sheet for Healthcare Providers:  SeriousBroker.it  This test is no t yet approved or cleared by the [place] FDA and  has been authorized for detection and/or diagnosis of SARS-CoV-2 by FDA under an Emergency Use Authorization (EUA). This EUA will remain  in effect (meaning this test can be used) for the duration of the COVID-19 declaration under Section 564(b)(1) of the Act, 21 U.S.C.section 360bbb-3(b)(1), unless the authorization is terminated  or revoked sooner.       Influenza A by PCR NEGATIVE NEGATIVE Final   Influenza B by PCR NEGATIVE NEGATIVE Final  Comment: (NOTE) The Xpert Xpress SARS-CoV-2/FLU/RSV plus assay is intended as an aid in the diagnosis of influenza from Nasopharyngeal swab specimens and should not be used as a sole basis for treatment. Nasal washings and aspirates are unacceptable for Xpert Xpress SARS-CoV-2/FLU/RSV testing.  Fact Sheet for Patients: BloggerCourse.com  Fact Sheet for Healthcare Providers: SeriousBroker.it  This test is not yet approved or cleared by the [place] FDA and has been authorized for detection and/or diagnosis of SARS-CoV-2 by FDA under an Emergency Use Authorization (EUA). This EUA will remain in effect (meaning this test can be used) for the duration of the COVID-19 declaration under Section 564(b)(1) of the Act, 21 U.S.C. section 360bbb-3(b)(1), unless the authorization is terminated or revoked.  Performed at [facility], [address], [place], [place] [address]      Time coordinating discharge:  32 minutes  SIGNED:   Dorcas Carrow, MD  [facility] 03/01/2021, 1:09 PM

## 2021-03-07 ENCOUNTER — Other Ambulatory Visit: Payer: Self-pay

## 2021-03-07 ENCOUNTER — Ambulatory Visit: Payer: Medicaid Other | Admitting: Nurse Practitioner

## 2021-12-09 ENCOUNTER — Other Ambulatory Visit: Payer: Self-pay
